# Patient Record
Sex: Female | Born: 1995
Health system: Southern US, Community
[De-identification: ages and names within clinical notes are randomized; demographics above are authoritative.]

## PROBLEM LIST (undated history)

## (undated) ENCOUNTER — Inpatient Hospital Stay (HOSPITAL_COMMUNITY): Payer: Self-pay

## (undated) DIAGNOSIS — Z789 Other specified health status: Secondary | ICD-10-CM

## (undated) HISTORY — PX: FRACTURE SURGERY: SHX138

---

## 2017-07-10 ENCOUNTER — Emergency Department (HOSPITAL_COMMUNITY)
Admission: EM | Admit: 2017-07-10 | Discharge: 2017-07-10 | Disposition: A | Discharge status: Home / Self Care | Payer: Self-pay | Attending: Emergency Medicine | Admitting: Emergency Medicine

## 2017-07-10 ENCOUNTER — Emergency Department (HOSPITAL_COMMUNITY): Payer: Self-pay

## 2017-07-10 ENCOUNTER — Encounter (HOSPITAL_COMMUNITY): Payer: Self-pay

## 2017-07-10 DIAGNOSIS — Y999 Unspecified external cause status: Secondary | ICD-10-CM | POA: Insufficient documentation

## 2017-07-10 DIAGNOSIS — S93491A Sprain of other ligament of right ankle, initial encounter: Secondary | ICD-10-CM

## 2017-07-10 DIAGNOSIS — Y939 Activity, unspecified: Secondary | ICD-10-CM | POA: Insufficient documentation

## 2017-07-10 DIAGNOSIS — X509XXA Other and unspecified overexertion or strenuous movements or postures, initial encounter: Secondary | ICD-10-CM | POA: Insufficient documentation

## 2017-07-10 DIAGNOSIS — Y929 Unspecified place or not applicable: Secondary | ICD-10-CM | POA: Insufficient documentation

## 2017-07-10 DIAGNOSIS — S93431A Sprain of tibiofibular ligament of right ankle, initial encounter: Secondary | ICD-10-CM | POA: Insufficient documentation

## 2017-07-10 MED ORDER — IBUPROFEN 600 MG PO TABS: 600.0000 mg | ORAL_TABLET | Freq: Four times a day (QID) | ORAL | 0 refills | Status: DC | PRN

## 2017-07-10 NOTE — Discharge Instructions (Signed)
Please see the information and instructions below regarding your visit.  Your diagnoses today include:  1. Sprain of anterior talofibular ligament of right ankle, initial encounter     Tests performed today include: See side panel of your discharge paperwork for testing performed today. Vital signs are listed at the bottom of these instructions.    An x-ray of your ankle - does NOT show any broken bones. The x-ray showed he had some swelling in your ankle joint.  Vital signs. See below for your results today.   Medications prescribed:    Take any prescribed medications only as prescribed, and any over the counter medications only as directed on the packaging.  You are prescribed ibuprofen, a non-steroidal anti-inflammatory agent (NSAID) for pain. You may take 600 mg every 6 hours as needed for pain. If still requiring this medication around the clock for acute pain after 10 days, please see your primary healthcare provider.  You may combine this medication with Tylenol, 650 mg every 6 hours, so you are receiving something for pain every 3 hours.  This is not a long-term medication unless under the care and direction of your primary provider. Taking this medication long-term and not under the supervision of a healthcare provider could increase the risk of stomach ulcers, kidney problems, and cardiovascular problems such as high blood pressure.   If you could be pregnant, do not take this medication and speak with your care provider about pain control.  Home care instructions:  Please follow any educational materials contained in this packet.    Follow any educational materials contained in this packet  Follow R.I.C.E. Protocol:  R - rest your injury   I  - use ice on injury without applying directly to skin  C - compress injury with bandage or splint  E - elevate the injury as much as possible   Follow-up instructions: Please follow-up with your primary care provider in for  further evaluation In one week if pain is still occurring.  Return instructions:   Please return if your toes are numb or tingling, appear gray or blue, or you have severe pain (also elevate leg and loosen splint or wrap)  Please return to the Emergency Department if you experience worsening symptoms.   Please return if you have any other emergent concerns.  Additional Information:  Your caregiver has diagnosed you as suffering from an ankle sprain. Ankle sprain occurs when the ligaments that hold the ankle joint together are stretched or torn. It may take 4 to 6 weeks to heal.  For Activity: If prescribed crutches, use crutches with non-weight bearing for the first few days. Then, you may walk on your ankle as the pain allows, or as instructed. Start gradually with weight bearing on the affected ankle. Once you can walk pain free, then try jogging. When you can run forwards, then you can try moving side-to-side. If you cannot walk without crutches in one week, you need a re-check.  Your vital signs today were: BP 112/68 (BP Location: Right Arm)    Pulse 83    Temp 97.9 F (36.6 C) (Oral)    Resp 19    LMP 06/30/2017 (Within Days)    SpO2 100%  If your blood pressure (BP) was elevated on multiple readings during this visit above 130 for the top number or above 80 for the bottom number, please have this repeated by your primary care provider within one month. --------------  Thank you for allowing [place] to  participate in your care today.

## 2017-07-10 NOTE — ED Triage Notes (Signed)
Pt states she was walking and stepped off a curb wrong on Tuesday. She reports pain in the ankle. Pt reports it is difficult to bear weight on the extremity.

## 2017-07-10 NOTE — ED Provider Notes (Signed)
[facility]-EMERGENCY DEPT Provider Note   CSN: [id number] Arrival date & time: 07/10/17  0910     History   Chief Complaint Chief Complaint  Patient presents with  . Foot Injury    HPI Monica Mayer is a 21 y.o. female.  HPI   Patient is a 21 year old female with no significant past medical history presenting for right ankle pain following stepping off a curb 3 days ago and twisting her ankle. Patient reports that this was a mechanical fall and there was no dizziness, lightheadedness, or syncope preceding. Patient reports she was initially weight bearing after the incident but subsequently developed more pain. Patient has been icing the right foot, staying off of the right foot, and taking Advil but had continued pain. Patient denies any loss of sensation to her right foot, or a cold extremity but reports that it felt different.  History reviewed. No pertinent past medical history.  There are no active problems to display for this patient.   History reviewed. No pertinent surgical history.  OB History    No data available       Home Medications    Prior to Admission medications   Medication Sig Start Date End Date Taking? Authorizing Provider  ibuprofen (ADVIL,MOTRIN) 600 MG tablet Take 1 tablet (600 mg total) by mouth every 6 (six) hours as needed. 07/10/17   Elisha PonderMurray, [place] Jost B, PA-C    Family History History reviewed. No pertinent family history.  Social History Social History  Substance Use Topics  . Smoking status: Never Smoker  . Smokeless tobacco: Never Used  . Alcohol use Yes     Comment: occ     Allergies   Patient has no known allergies.   Review of Systems Review of Systems  Musculoskeletal: Positive for joint swelling.  Skin: Negative for color change and pallor.  Neurological: Negative for dizziness, syncope, weakness, light-headedness and numbness.     Physical Exam Updated Vital Signs BP 112/68 (BP Location: Right Arm)   Pulse 83   Temp  97.9 F (36.6 C) (Oral)   Resp 19   LMP 06/30/2017 (Within Days)   SpO2 100%   Physical Exam  Constitutional: She appears well-developed and well-nourished. No distress.  Sitting comfortably in bed.  HENT:  Head: Normocephalic and atraumatic.  Eyes: Conjunctivae are normal. Right eye exhibits no discharge. Left eye exhibits no discharge.  EOMs normal to gross examination.  Neck: Normal range of motion.  Pulmonary/Chest:  Normal respiratory effort. Patient converses comfortably. No audible wheeze or stridor.  Abdominal: She exhibits no distension.  Musculoskeletal:  DP pulses 2+ and equal bilaterally. Sensation intact to light touch in right distal lower extremity. Fibular head tenderness. No tenderness of the medial malleolus. No proximal fibula tenderness. No point tenderness to base of right fifth metatarsal, navicular. Range of motion is poor flexion and extension, eversion of right ankle,  but limited with inversion due to pain.   Neurological: She is alert.  Cranial nerves intact to gross observation. Patient moves extremities without difficulty.  Skin: Skin is warm and dry. She is not diaphoretic.  Psychiatric: She has a normal mood and affect. Her behavior is normal. Judgment and thought content normal.  Nursing note and vitals reviewed.    ED Treatments / Results  Labs (all labs ordered are listed, but only abnormal results are displayed) Labs Reviewed - No data to display  EKG  EKG Interpretation None       Radiology Dg Ankle Complete Right  Result  Date: 07/10/2017 CLINICAL DATA:  Right ankle injury 3 days ago. EXAM: RIGHT ANKLE - COMPLETE 3+ VIEW COMPARISON:  None. FINDINGS: No acute fracture or malalignment. Small tibiotalar joint effusion. The talar dome is intact. The ankle mortise is symmetric. Bone mineralization is normal. Soft tissues are unremarkable. IMPRESSION: Small tibiotalar joint effusion.  No acute osseous abnormality. Electronically Signed   By:  Obie Dredge M.D.   On: 07/10/2017 10:01    Procedures Procedures (including critical care time)  Medications Ordered in ED Medications - No data to display   Initial Impression / Assessment and Plan / ED Course  I have reviewed the triage vital signs and the nursing notes.  Pertinent labs & imaging results that were available during my care of the patient were reviewed by me and considered in my medical decision making (see chart for details).  Clinical Course as of Jul 10 2205  Thu Jul 10, 2017  1134 DG Ankle Complete Right [AM]    Clinical Course User Index [AM] Elisha Ponder, PA-C     Final Clinical Impressions(s) / ED Diagnoses   Final diagnoses:  Sprain of anterior talofibular ligament of right ankle, initial encounter   MDM  Patient is a 21 year old female presenting for right ankle pain following rolling her ankle. Complete x-rays of the right ankle in the ED today demonstrate no evidence of fracture and show mild joint effusion of the tibiotalar joint. Patient was counseled on RICE protocol and told to rest injury, use ice, compress the area, and elevate above the level of their heart as much as possible to reduce swelling. Aircast and crutches dispensed. Questions answered. Patient verbalized understanding.    New Prescriptions Discharge Medication List as of 07/10/2017 12:01 PM    START taking these medications   Details  ibuprofen (ADVIL,MOTRIN) 600 MG tablet Take 1 tablet (600 mg total) by mouth every 6 (six) hours as needed., Starting Thu 07/10/2017, Print         [place], [place] B, PA-C 07/10/17 2210    Tilden Fossa, MD 07/11/17 1212

## 2019-03-09 ENCOUNTER — Encounter (HOSPITAL_BASED_OUTPATIENT_CLINIC_OR_DEPARTMENT_OTHER): Payer: Self-pay | Admitting: Emergency Medicine

## 2019-03-09 ENCOUNTER — Emergency Department (HOSPITAL_BASED_OUTPATIENT_CLINIC_OR_DEPARTMENT_OTHER)
Admission: EM | Admit: 2019-03-09 | Discharge: 2019-03-09 | Disposition: A | Discharge status: Home / Self Care | Payer: No Typology Code available for payment source | Attending: Emergency Medicine | Admitting: Emergency Medicine

## 2019-03-09 ENCOUNTER — Other Ambulatory Visit: Payer: Self-pay

## 2019-03-09 ENCOUNTER — Emergency Department (HOSPITAL_BASED_OUTPATIENT_CLINIC_OR_DEPARTMENT_OTHER): Payer: No Typology Code available for payment source

## 2019-03-09 DIAGNOSIS — M25511 Pain in right shoulder: Secondary | ICD-10-CM | POA: Diagnosis not present

## 2019-03-09 DIAGNOSIS — M25551 Pain in right hip: Secondary | ICD-10-CM | POA: Diagnosis not present

## 2019-03-09 LAB — PREGNANCY, URINE: Preg Test, Ur: NEGATIVE

## 2019-03-09 NOTE — Discharge Instructions (Signed)
Recheck with your doctor. Take Motrin and Tylenol as needed as directed for pain.
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
## 2019-03-12 ENCOUNTER — Emergency Department (HOSPITAL_BASED_OUTPATIENT_CLINIC_OR_DEPARTMENT_OTHER): Payer: 59

## 2019-03-12 ENCOUNTER — Other Ambulatory Visit: Payer: Self-pay

## 2019-03-12 ENCOUNTER — Emergency Department (HOSPITAL_BASED_OUTPATIENT_CLINIC_OR_DEPARTMENT_OTHER)
Admission: EM | Admit: 2019-03-12 | Discharge: 2019-03-12 | Disposition: A | Discharge status: Home / Self Care | Payer: 59 | Attending: Emergency Medicine | Admitting: Emergency Medicine

## 2019-03-12 ENCOUNTER — Encounter (HOSPITAL_BASED_OUTPATIENT_CLINIC_OR_DEPARTMENT_OTHER): Payer: Self-pay | Admitting: Emergency Medicine

## 2019-03-12 DIAGNOSIS — R0789 Other chest pain: Secondary | ICD-10-CM | POA: Diagnosis present

## 2019-03-12 DIAGNOSIS — M791 Myalgia, unspecified site: Secondary | ICD-10-CM | POA: Insufficient documentation

## 2019-03-12 MED ORDER — NAPROXEN 375 MG PO TABS: 375.0000 mg | ORAL_TABLET | Freq: Two times a day (BID) | ORAL | 0 refills | Status: DC

## 2019-03-12 MED ORDER — METHOCARBAMOL 500 MG PO TABS: 500.0000 mg | ORAL_TABLET | Freq: Two times a day (BID) | ORAL | 0 refills | Status: DC

## 2019-03-12 MED FILL — NAPROXEN 375 MG TABLET: 375 | 10 days supply | Qty: 20 | Fill #0

## 2019-03-12 MED FILL — METHOCARBAMOL 500 MG TABLET: 500 | 5 days supply | Qty: 10 | Fill #0
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
## 2019-03-12 NOTE — ED Notes (Signed)
Patient transported to X-ray 
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
## 2019-03-12 NOTE — ED Notes (Signed)
ED Provider at bedside. 
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
## 2020-02-06 ENCOUNTER — Emergency Department (HOSPITAL_BASED_OUTPATIENT_CLINIC_OR_DEPARTMENT_OTHER): Payer: 59

## 2020-02-06 ENCOUNTER — Emergency Department (HOSPITAL_BASED_OUTPATIENT_CLINIC_OR_DEPARTMENT_OTHER)
Admission: EM | Admit: 2020-02-06 | Discharge: 2020-02-06 | Disposition: A | Discharge status: Home / Self Care | Payer: 59 | Attending: Emergency Medicine | Admitting: Emergency Medicine

## 2020-02-06 ENCOUNTER — Encounter (HOSPITAL_BASED_OUTPATIENT_CLINIC_OR_DEPARTMENT_OTHER): Payer: Self-pay | Admitting: Emergency Medicine

## 2020-02-06 ENCOUNTER — Other Ambulatory Visit: Payer: Self-pay

## 2020-02-06 DIAGNOSIS — K208 Other esophagitis without bleeding: Secondary | ICD-10-CM | POA: Diagnosis not present

## 2020-02-06 DIAGNOSIS — J029 Acute pharyngitis, unspecified: Secondary | ICD-10-CM | POA: Diagnosis present

## 2020-02-06 DIAGNOSIS — Z79899 Other long term (current) drug therapy: Secondary | ICD-10-CM | POA: Insufficient documentation

## 2020-02-06 DIAGNOSIS — Z791 Long term (current) use of non-steroidal anti-inflammatories (NSAID): Secondary | ICD-10-CM | POA: Insufficient documentation

## 2020-02-06 DIAGNOSIS — T50905A Adverse effect of unspecified drugs, medicaments and biological substances, initial encounter: Secondary | ICD-10-CM

## 2020-02-06 LAB — PREGNANCY, URINE: Preg Test, Ur: NEGATIVE

## 2020-02-06 LAB — GROUP A STREP BY PCR: Group A Strep by PCR: NOT DETECTED

## 2020-02-06 NOTE — ED Triage Notes (Signed)
Sore throat for 1 week, states she took 2 white pills on Thursday. States they are stuck in her throat. Seen at [facility] last night for same. Airway intact. Speech clear. Able to swallow, ate and drank today without problems.

## 2020-02-06 NOTE — ED Provider Notes (Signed)
[facility] EMERGENCY DEPARTMENT Provider Note   CSN: [id number] Arrival date & time: 02/06/20  0247     History Chief Complaint  Patient presents with  . Sore Throat  . Airway Obstruction    Monica Mayer is a 24 y.o. female.  24 year old female who presents with pills stuck in throat.  3 days ago, the patient took 2 over-the-counter pills for chest congestion and mucus and she feels like the pills got stuck in her throat despite drinking a good amount of water.  Since that time, she has continued to have the sensation that the pills are stuck in her throat.  She is able to swallow liquids and denies any problems with vomiting or shortness of breath.  She was evaluated at an outside hospital ED last night for this problem and eventually discharged home.  She presents here with persistent symptoms.  The history is provided by the patient.  Sore Throat       History reviewed. No pertinent past medical history.  There are no problems to display for this patient.   Past Surgical History:  Procedure Laterality Date  . FRACTURE SURGERY     Right arm fracture repair     OB History   No obstetric history on file.     No family history on file.  Social History   Tobacco Use  . Smoking status: Never Smoker  . Smokeless tobacco: Never Used  Substance Use Topics  . Alcohol use: Yes    Comment: occ  . Drug use: Yes    Types: Marijuana    Home Medications Prior to Admission medications   Medication Sig Start Date End Date Taking? Authorizing Provider  methocarbamol (ROBAXIN) 500 MG tablet Take 1 tablet (500 mg total) by mouth 2 (two) times daily. 03/12/19   Langston Masker B, PA-C  naproxen (NAPROSYN) 375 MG tablet Take 1 tablet (375 mg total) by mouth 2 (two) times daily. 03/12/19   Langston Masker B, PA-C    Allergies    Patient has no known allergies.  Review of Systems   Review of Systems All other systems reviewed and are negative except that which  was mentioned in HPI  Physical Exam Updated Vital Signs BP 110/64   Pulse 95   Temp 98.5 F (36.9 C) (Oral)   Resp 16   Ht 5\' 2"  (1.575 m)   Wt 68 kg   LMP 12/28/2019 (Approximate)   SpO2 98%   BMI 27.44 kg/m   Physical Exam Vitals and nursing note reviewed.  Constitutional:      General: She is not in acute distress.    Appearance: She is well-developed.  HENT:     Head: Normocephalic and atraumatic.  Eyes:     Conjunctiva/sclera: Conjunctivae normal.  Cardiovascular:     Rate and Rhythm: Normal rate and regular rhythm.     Heart sounds: Normal heart sounds. No murmur.  Pulmonary:     Effort: Pulmonary effort is normal.     Breath sounds: No stridor.     Comments: Frequent cough, rhonchi b/l but no wheezing or crackles Musculoskeletal:     Cervical back: Neck supple.  Skin:    General: Skin is warm and dry.  Neurological:     Mental Status: She is alert and oriented to person, place, and time.     Comments: Fluent speech  Psychiatric:        Judgment: Judgment normal.     ED Results / Procedures /  Treatments   Labs (all labs ordered are listed, but only abnormal results are displayed) Labs Reviewed  GROUP A STREP BY PCR  PREGNANCY, URINE    EKG None  Radiology DG Neck Soft Tissue  Result Date: 02/06/2020 CLINICAL DATA:  Foreign body sensation in the throat. EXAM: NECK SOFT TISSUES - 1+ VIEW COMPARISON:  None. FINDINGS: There is no evidence of retropharyngeal soft tissue swelling or epiglottic enlargement. The cervical airway is unremarkable and no radio-opaque foreign body identified. IMPRESSION: Negative. Electronically Signed   By: Elige Ko   On: 02/06/2020 06:36    Procedures Procedures (including critical care time)  Medications Ordered in ED Medications - No data to display  ED Course  I have reviewed the triage vital signs and the nursing notes.  Pertinent labs & imaging results that were available during my care of the patient were  reviewed by me and considered in my medical decision making (see chart for details).    MDM Rules/Calculators/A&P                      Pt well appearing, tolerating liquids here. Hx and timeline suggest pill esophagitis. Counseled on supportive measures and expectant management. Final Clinical Impression(s) / ED Diagnoses Final diagnoses:  Pill esophagitis    Rx / DC Orders ED Discharge Orders    None       Goebel Hellums, Ambrose [place], MD 02/06/20 [phone]

## 2020-11-13 ENCOUNTER — Encounter (HOSPITAL_BASED_OUTPATIENT_CLINIC_OR_DEPARTMENT_OTHER): Payer: Self-pay

## 2020-11-13 ENCOUNTER — Emergency Department (HOSPITAL_BASED_OUTPATIENT_CLINIC_OR_DEPARTMENT_OTHER)
Admission: EM | Admit: 2020-11-13 | Discharge: 2020-11-14 | Disposition: A | Discharge status: Home / Self Care | Payer: 59 | Attending: Emergency Medicine | Admitting: Emergency Medicine

## 2020-11-13 ENCOUNTER — Other Ambulatory Visit: Payer: Self-pay

## 2020-11-13 DIAGNOSIS — R112 Nausea with vomiting, unspecified: Secondary | ICD-10-CM | POA: Diagnosis not present

## 2020-11-13 DIAGNOSIS — R102 Pelvic and perineal pain: Secondary | ICD-10-CM | POA: Insufficient documentation

## 2020-11-13 DIAGNOSIS — R197 Diarrhea, unspecified: Secondary | ICD-10-CM | POA: Insufficient documentation

## 2020-11-13 DIAGNOSIS — R109 Unspecified abdominal pain: Secondary | ICD-10-CM

## 2020-11-13 DIAGNOSIS — K529 Noninfective gastroenteritis and colitis, unspecified: Secondary | ICD-10-CM

## 2020-11-13 LAB — URINALYSIS, MICROSCOPIC (REFLEX): RBC / HPF: NONE SEEN RBC/hpf (ref 0–5)

## 2020-11-13 LAB — URINALYSIS, ROUTINE W REFLEX MICROSCOPIC
Bilirubin Urine: NEGATIVE
Glucose, UA: NEGATIVE mg/dL
Hgb urine dipstick: NEGATIVE
Ketones, ur: 40 mg/dL — AB
Nitrite: NEGATIVE
Protein, ur: NEGATIVE mg/dL
Specific Gravity, Urine: 1.005 (ref 1.005–1.030)
pH: 8.5 — ABNORMAL HIGH (ref 5.0–8.0)

## 2020-11-13 LAB — PREGNANCY, URINE: Preg Test, Ur: NEGATIVE

## 2020-11-13 MED ORDER — SODIUM CHLORIDE 0.9 % IV BOLUS
1000.0000 mL | Freq: Once | INTRAVENOUS | Status: AC
  Administered 2020-11-14: 1000 mL via INTRAVENOUS

## 2020-11-13 NOTE — ED Triage Notes (Addendum)
Pt presents from home with pelvic pain since Saturday. Also endorses nausea and vomiting. States she has had some blood in her urine or vaginal bleeding as well.
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
## 2020-11-13 NOTE — ED Notes (Signed)
Pt states abdomial/pelvic pain with nausea and 5 ep of vomiting, states soft to liquid stool with some blood

## 2020-11-14 ENCOUNTER — Emergency Department (HOSPITAL_BASED_OUTPATIENT_CLINIC_OR_DEPARTMENT_OTHER): Payer: 59

## 2020-11-14 MED ORDER — IOHEXOL 300 MG/ML  SOLN
100.0000 mL | Freq: Once | INTRAMUSCULAR | Status: AC | PRN
  Administered 2020-11-14: 80 mL via INTRAVENOUS

## 2020-11-14 NOTE — Discharge Instructions (Addendum)
Drink plenty of fluids and get plenty of rest.  Follow-up with primary doctor if symptoms or not improving in the next 2 to 3 days, and return to the ER for any new and/or concerning symptoms.

## 2020-11-15 LAB — GC/CHLAMYDIA PROBE AMP (~~LOC~~) NOT AT ARMC
Chlamydia: POSITIVE — AB
Comment: NEGATIVE
Comment: NORMAL
Neisseria Gonorrhea: NEGATIVE

## 2020-11-24 ENCOUNTER — Telehealth: Payer: Self-pay | Admitting: Medical

## 2020-11-24 DIAGNOSIS — A749 Chlamydial infection, unspecified: Secondary | ICD-10-CM

## 2020-11-24 MED ORDER — AZITHROMYCIN 250 MG PO TABS: 1000.0000 mg | ORAL_TABLET | Freq: Once | ORAL | 0 refills | Status: AC

## 2020-11-24 NOTE — Telephone Encounter (Signed)
Monica Mayer tested positive for  Chlamydia. Patient was called by RN and allergies and pharmacy confirmed. Rx sent to pharmacy of choice.   Marny Lowenstein, PA-C 11/24/2020 4:22 PM

## 2021-01-05 ENCOUNTER — Encounter (HOSPITAL_COMMUNITY): Payer: Self-pay | Admitting: *Deleted

## 2021-01-05 ENCOUNTER — Other Ambulatory Visit: Payer: Self-pay

## 2021-01-05 ENCOUNTER — Emergency Department (HOSPITAL_COMMUNITY)
Admission: EM | Admit: 2021-01-05 | Discharge: 2021-01-05 | Disposition: A | Discharge status: Home / Self Care | Payer: 59 | Attending: Emergency Medicine | Admitting: Emergency Medicine

## 2021-01-05 DIAGNOSIS — N750 Cyst of Bartholin's gland: Secondary | ICD-10-CM | POA: Diagnosis not present

## 2021-01-05 MED ORDER — LIDOCAINE HCL (PF) 2 % IJ SOLN: 10.0000 mL | Freq: Once | INTRAMUSCULAR | Status: DC

## 2021-01-05 MED ORDER — LIDOCAINE HCL (PF) 1 % IJ SOLN
INTRAMUSCULAR | Status: AC
  Filled 2021-01-05: qty 30

## 2021-01-05 MED ORDER — DOXYCYCLINE HYCLATE 100 MG PO TABS: 100.0000 mg | ORAL_TABLET | Freq: Two times a day (BID) | ORAL | 0 refills | Status: DC

## 2021-01-05 NOTE — ED Triage Notes (Signed)
States she has a cyst on her right labia, history of same

## 2021-01-05 NOTE — Discharge Instructions (Addendum)
Return if any problems.  The catheter will need to come out in 4 days  Schedule to see obgyn for evaltuation.

## 2021-01-05 NOTE — ED Notes (Signed)
Entered room and introduced self to patient. Pt appears to be resting in bed, respirations are even and unlabored with equal chest rise and fall. Bed is locked in the lowest position, side rails x2, call bell within reach. Pt educated on call light use and hourly rounding, verbalized understanding and in agreement at this time. All questions and concerns voiced addressed. Refreshments offered and provided per patient request.  

## 2021-01-05 NOTE — ED Notes (Signed)
ED Provider at bedside for cyst I&D.

## 2021-01-06 NOTE — ED Provider Notes (Signed)
[facility] EMERGENCY DEPARTMENT Provider Note   CSN: [id number] Arrival date & time: 01/05/21  1619     History Chief Complaint  Patient presents with  . Bartholin's Cyst    Eleyna Brugh is a 25 y.o. female.  Pt complains of a bartolin cyst.  Pt has had 2 previous cyst  The history is provided by the patient. No language interpreter was used.  Abscess Location:  Torso Size:  2 Abscess quality: redness   Progression:  Worsening Chronicity:  New Relieved by:  Nothing Worsened by:  Nothing Ineffective treatments:  None tried Associated symptoms: no nausea        History reviewed. No pertinent past medical history.  There are no problems to display for this patient.   Past Surgical History:  Procedure Laterality Date  . FRACTURE SURGERY     Right arm fracture repair     OB History   No obstetric history on file.     No family history on file.  Social History   Tobacco Use  . Smoking status: Never Smoker  . Smokeless tobacco: Never Used  Vaping Use  . Vaping Use: Every day  Substance Use Topics  . Alcohol use: Yes    Comment: occ  . Drug use: Yes    Types: Marijuana    Home Medications Prior to Admission medications   Medication Sig Start Date End Date Taking? Authorizing Provider  doxycycline (VIBRA-TABS) 100 MG tablet Take 1 tablet (100 mg total) by mouth 2 (two) times daily. 01/05/21  Yes Cheron Schaumann K, PA-C  methocarbamol (ROBAXIN) 500 MG tablet Take 1 tablet (500 mg total) by mouth 2 (two) times daily. 03/12/19   Aviva Kluver B, PA-C  naproxen (NAPROSYN) 375 MG tablet Take 1 tablet (375 mg total) by mouth 2 (two) times daily. 03/12/19   Aviva Kluver B, PA-C    Allergies    Patient has no known allergies.  Review of Systems   Review of Systems  Gastrointestinal: Negative for nausea.  All other systems reviewed and are negative.   Physical Exam Updated Vital Signs BP 118/71 (BP Location: Right Arm)   Pulse 67   Temp 98.2 F  (36.8 C) (Oral)   Resp 16   SpO2 100%   Physical Exam Vitals and nursing note reviewed.  Constitutional:      Appearance: She is well-developed and well-nourished.  HENT:     Head: Normocephalic.  Eyes:     Extraocular Movements: EOM normal.  Cardiovascular:     Rate and Rhythm: Normal rate.  Pulmonary:     Effort: Pulmonary effort is normal.  Abdominal:     General: There is no distension.  Genitourinary:    Comments: Swollen right labia Musculoskeletal:        General: Normal range of motion.     Cervical back: Normal range of motion.  Neurological:     General: No focal deficit present.     Mental Status: She is alert and oriented to person, place, and time.  Psychiatric:        Mood and Affect: Mood and affect and mood normal.     ED Results / Procedures / Treatments   Labs (all labs ordered are listed, but only abnormal results are displayed) Labs Reviewed - No data to display  EKG None  Radiology No results found.  Procedures .Marland KitchenIncision and Drainage  Date/Time: 01/06/2021 3:57 PM Performed by: Elson Areas, PA-C Authorized by: Elson Areas, PA-C  Consent:    Consent obtained:  Verbal   Consent given by:  Patient   Risks discussed:  Bleeding, incomplete drainage, pain and damage to other organs   Alternatives discussed:  No treatment Universal protocol:    Procedure explained and questions answered to patient or proxy's satisfaction: yes     Relevant documents present and verified: yes     Test results available : yes     Imaging studies available: yes     Required blood products, implants, devices, and special equipment available: yes     Site/side marked: yes     Immediately prior to procedure, a time out was called: yes     Patient identity confirmed:  Verbally with patient Location:    Type:  Bartholin cyst   Size:  2   Location:  Anogenital   Anogenital location:  Bartholin's gland Pre-procedure details:    Skin preparation:   Betadine Anesthesia:    Anesthesia method:  Local infiltration   Local anesthetic:  Lidocaine 1% w/o epi Procedure type:    Complexity:  Complex Procedure details:    Ultrasound guidance: no     Incision types:  Single straight   Incision depth:  Subcutaneous   Scalpel blade:  11   Wound management:  Probed and deloculated, irrigated with saline and extensive cleaning   Drainage:  Purulent   Drainage amount:  Moderate   Packing materials:  Word catheter (Word catheter) Post-procedure details:    Procedure completion:  Tolerated well, no immediate complications     Medications Ordered in ED Medications - No data to display  ED Course  I have reviewed the triage vital signs and the nursing notes.  Pertinent labs & imaging results that were available during my care of the patient were reviewed by me and considered in my medical decision making (see chart for details).    MDM Rules/Calculators/A&P                          MDM:  Pt advised to follow up with gyn in 4 weeks.  Final Clinical Impression(s) / ED Diagnoses Final diagnoses:  Bartholin cyst    Rx / DC Orders ED Discharge Orders         Ordered    doxycycline (VIBRA-TABS) 100 MG tablet  2 times daily        01/05/21 1905        An After Visit Summary was printed and given to the patient.    Elson Areas, [place] 01/06/21 1558    Maia Plan, MD 01/08/21 1023

## 2021-04-16 ENCOUNTER — Other Ambulatory Visit: Payer: Self-pay

## 2021-04-16 ENCOUNTER — Emergency Department (HOSPITAL_COMMUNITY)
Admission: EM | Admit: 2021-04-16 | Discharge: 2021-04-16 | Disposition: A | Discharge status: Home / Self Care | Payer: 59 | Attending: Emergency Medicine | Admitting: Emergency Medicine

## 2021-04-16 ENCOUNTER — Encounter (HOSPITAL_COMMUNITY): Payer: Self-pay | Admitting: Emergency Medicine

## 2021-04-16 DIAGNOSIS — W57XXXA Bitten or stung by nonvenomous insect and other nonvenomous arthropods, initial encounter: Secondary | ICD-10-CM | POA: Diagnosis not present

## 2021-04-16 DIAGNOSIS — S30860A Insect bite (nonvenomous) of lower back and pelvis, initial encounter: Secondary | ICD-10-CM | POA: Insufficient documentation

## 2021-04-16 MED ORDER — DOXYCYCLINE HYCLATE 100 MG PO TABS
200.0000 mg | ORAL_TABLET | Freq: Once | ORAL | Status: AC
  Administered 2021-04-16: 200 mg via ORAL
  Filled 2021-04-16: qty 2

## 2021-04-16 NOTE — ED Triage Notes (Signed)
Pt c/o of tick bite to left lower back. Tick still attached. Needs assistance with removal.

## 2021-04-16 NOTE — Discharge Instructions (Signed)
The tick was removed successfully. Keep the wound site clean.  If you start noticing any rash develop, have your primary care doctor assess you immediately.

## 2021-04-16 NOTE — ED Provider Notes (Signed)
[facility] EMERGENCY DEPARTMENT Provider Note   CSN: [id number] Arrival date & time: 04/16/21  8469     History Chief Complaint  Patient presents with  . Insect Bite    Monica Mayer is a 25 y.o. female.  HPI    25 year old female comes in with chief complaint of insect bite.  Patient reports that she woke up this morning, noted a tick on her back.  She is quite certain that she did not have a tick on her yesterday.  She did do some yard work yesterday that might have led to the tick exposure.  History reviewed. No pertinent past medical history.  There are no problems to display for this patient.   Past Surgical History:  Procedure Laterality Date  . FRACTURE SURGERY     Right arm fracture repair     OB History   No obstetric history on file.     History reviewed. No pertinent family history.  Social History   Tobacco Use  . Smoking status: Never Smoker  . Smokeless tobacco: Never Used  Vaping Use  . Vaping Use: Every day  Substance Use Topics  . Alcohol use: Yes    Comment: occ  . Drug use: Yes    Types: Marijuana    Home Medications Prior to Admission medications   Medication Sig Start Date End Date Taking? Authorizing Provider  doxycycline (VIBRA-TABS) 100 MG tablet Take 1 tablet (100 mg total) by mouth 2 (two) times daily. Patient not taking: Reported on 04/16/2021 01/05/21   Elson Areas, PA-C  methocarbamol (ROBAXIN) 500 MG tablet Take 1 tablet (500 mg total) by mouth 2 (two) times daily. Patient not taking: Reported on 04/16/2021 03/12/19   Aviva Kluver B, PA-C  naproxen (NAPROSYN) 375 MG tablet Take 1 tablet (375 mg total) by mouth 2 (two) times daily. Patient not taking: Reported on 04/16/2021 03/12/19   Aviva Kluver B, PA-C    Allergies    No known allergies  Review of Systems   Review of Systems  Constitutional: Negative for activity change.  Skin: Negative for rash.    Physical Exam Updated Vital Signs BP 107/71 (BP Location: Left  Arm)   Pulse 63   Temp 98.1 F (36.7 C) (Oral)   Resp 14   Ht 5\' 2"  (1.575 m)   Wt 62.6 kg   SpO2 100%   BMI 25.24 kg/m   Physical Exam Vitals and nursing note reviewed.  Pulmonary:     Effort: Pulmonary effort is normal.  Skin:    Findings: No rash.     Comments: Engorged tick attached to the lower back, left side.  + Small area of surrounding erythema  Neurological:     Mental Status: She is alert.     ED Results / Procedures / Treatments   Labs (all labs ordered are listed, but only abnormal results are displayed) Labs Reviewed - No data to display  EKG None  Radiology No results found.  Procedures .Foreign Body Removal  Date/Time: 04/16/2021 9:06 AM Performed by: 06/16/2021, MD Authorized by: Derwood Kaplan, MD  Consent: Verbal consent obtained. Risks and benefits: risks, benefits and alternatives were discussed Consent given by: patient Patient understanding: patient states understanding of the procedure being performed Patient identity confirmed: arm band Time out: Immediately prior to procedure a "time out" was called to verify the correct patient, procedure, equipment, support staff and site/side marked as required. Body area: skin General location: trunk Location details: back  Sedation:  Patient sedated: no  Patient restrained: no Patient cooperative: yes 1 objects recovered. Objects recovered: Engorged tick Post-procedure assessment: foreign body removed     Medications Ordered in ED Medications  doxycycline (VIBRA-TABS) tablet 200 mg (has no administration in time range)    ED Course  I have reviewed the triage vital signs and the nursing notes.  Pertinent labs & imaging results that were available during my care of the patient were reviewed by me and considered in my medical decision making (see chart for details).    MDM Rules/Calculators/A&P                          25 year old comes in a chief complaint of tick  bite. Patient has an engorged tick attached to the back, it was removed successfully without any problems.  The wound site was cleaned with antiseptic. 200 mg oral doxycycline to be given.  Tick bite information provided  Final Clinical Impression(s) / ED Diagnoses Final diagnoses:  Tick bite of lower back, initial encounter    Rx / DC Orders ED Discharge Orders    None       Derwood Kaplan, MD 04/16/21 [phone]

## 2021-05-31 ENCOUNTER — Emergency Department (HOSPITAL_COMMUNITY)
Admission: EM | Admit: 2021-05-31 | Discharge: 2021-05-31 | Disposition: A | Discharge status: Home / Self Care | Payer: 59 | Attending: Emergency Medicine | Admitting: Emergency Medicine

## 2021-05-31 ENCOUNTER — Other Ambulatory Visit: Payer: Self-pay

## 2021-05-31 ENCOUNTER — Encounter (HOSPITAL_COMMUNITY): Payer: Self-pay | Admitting: *Deleted

## 2021-05-31 DIAGNOSIS — R195 Other fecal abnormalities: Secondary | ICD-10-CM

## 2021-05-31 DIAGNOSIS — R197 Diarrhea, unspecified: Secondary | ICD-10-CM | POA: Insufficient documentation

## 2021-05-31 LAB — CBC WITH DIFFERENTIAL/PLATELET
Abs Immature Granulocytes: 0.01 10*3/uL (ref 0.00–0.07)
Basophils Absolute: 0 10*3/uL (ref 0.0–0.1)
Basophils Relative: 0 %
Eosinophils Absolute: 0.1 10*3/uL (ref 0.0–0.5)
Eosinophils Relative: 2 %
HCT: 42.3 % (ref 36.0–46.0)
Hemoglobin: 13.6 g/dL (ref 12.0–15.0)
Immature Granulocytes: 0 %
Lymphocytes Relative: 43 %
Lymphs Abs: 2.8 10*3/uL (ref 0.7–4.0)
MCH: 29.3 pg (ref 26.0–34.0)
MCHC: 32.2 g/dL (ref 30.0–36.0)
MCV: 91.2 fL (ref 80.0–100.0)
Monocytes Absolute: 0.5 10*3/uL (ref 0.1–1.0)
Monocytes Relative: 8 %
Neutro Abs: 3 10*3/uL (ref 1.7–7.7)
Neutrophils Relative %: 47 %
Platelets: 159 10*3/uL (ref 150–400)
RBC: 4.64 MIL/uL (ref 3.87–5.11)
RDW: 14.1 % (ref 11.5–15.5)
WBC: 6.4 10*3/uL (ref 4.0–10.5)
nRBC: 0 % (ref 0.0–0.2)

## 2021-05-31 LAB — BASIC METABOLIC PANEL
Anion gap: 8 (ref 5–15)
BUN: 9 mg/dL (ref 6–20)
CO2: 25 mmol/L (ref 22–32)
Calcium: 9.6 mg/dL (ref 8.9–10.3)
Chloride: 100 mmol/L (ref 98–111)
Creatinine, Ser: 0.66 mg/dL (ref 0.44–1.00)
GFR, Estimated: >60 mL/min (ref >60)
Glucose, Bld: 92 mg/dL (ref 70–99)
Potassium: 4 mmol/L (ref 3.5–5.1)
Sodium: 133 mmol/L — ABNORMAL LOW (ref 135–145)

## 2021-05-31 LAB — PREGNANCY, URINE: Preg Test, Ur: NEGATIVE

## 2021-05-31 NOTE — ED Provider Notes (Signed)
[facility] EMERGENCY DEPARTMENT Provider Note   CSN: [id number] Arrival date & time: 05/31/21  1019     History Chief Complaint  Patient presents with   Blood In Stools    Monica Mayer is a 25 y.o. female.  Patient presents with intermittent episodes of blood in the stool for the past few months.  Initially thought it was a fissure however it persists.  No pain.  No abdominal tenderness or vomiting.  No blood thinner use.  No history of gastroenterologist or scope.  No syncope or persistent lightheadedness.  No fevers or chills.  No urinary symptoms.      History reviewed. No pertinent past medical history.  There are no problems to display for this patient.   Past Surgical History:  Procedure Laterality Date   FRACTURE SURGERY     Right arm fracture repair     OB History   No obstetric history on file.     No family history on file.  Social History   Tobacco Use   Smoking status: Never   Smokeless tobacco: Never  Vaping Use   Vaping Use: Every day  Substance Use Topics   Alcohol use: Yes    Comment: occ   Drug use: Yes    Types: Marijuana    Home Medications Prior to Admission medications   Medication Sig Start Date End Date Taking? Authorizing Provider  doxycycline (VIBRA-TABS) 100 MG tablet Take 1 tablet (100 mg total) by mouth 2 (two) times daily. Patient not taking: Reported on 04/16/2021 01/05/21   Elson Areas, PA-C  methocarbamol (ROBAXIN) 500 MG tablet Take 1 tablet (500 mg total) by mouth 2 (two) times daily. Patient not taking: Reported on 04/16/2021 03/12/19   Aviva Kluver B, PA-C  naproxen (NAPROSYN) 375 MG tablet Take 1 tablet (375 mg total) by mouth 2 (two) times daily. Patient not taking: Reported on 04/16/2021 03/12/19   Aviva Kluver B, PA-C    Allergies    No known allergies  Review of Systems   Review of Systems  Constitutional:  Negative for chills and fever.  HENT:  Negative for congestion.   Eyes:  Negative for visual  disturbance.  Respiratory:  Negative for shortness of breath.   Cardiovascular:  Negative for chest pain.  Gastrointestinal:  Positive for blood in stool. Negative for abdominal pain and vomiting.  Genitourinary:  Negative for dysuria and flank pain.  Musculoskeletal:  Negative for back pain, neck pain and neck stiffness.  Skin:  Negative for rash.  Neurological:  Negative for light-headedness and headaches.   Physical Exam Updated Vital Signs BP 114/73 (BP Location: Right Arm)   Pulse 69   Temp 99.1 F (37.3 C) (Oral)   Resp 18   Ht 5\' 2"  (1.575 m)   LMP 04/27/2021 (Exact Date)   SpO2 98%   BMI 25.24 kg/m   Physical Exam Vitals and nursing note reviewed.  Constitutional:      General: She is not in acute distress.    Appearance: She is well-developed.  HENT:     Head: Normocephalic and atraumatic.     Mouth/Throat:     Mouth: Mucous membranes are moist.  Eyes:     General:        Right eye: No discharge.        Left eye: No discharge.     Conjunctiva/sclera: Conjunctivae normal.  Neck:     Trachea: No tracheal deviation.  Cardiovascular:     Rate and Rhythm:  Normal rate.  Pulmonary:     Effort: Pulmonary effort is normal.  Abdominal:     General: There is no distension.     Palpations: Abdomen is soft.     Tenderness: There is no abdominal tenderness. There is no guarding.  Genitourinary:    Comments: Nurse chaperone present.  No external hemorrhoids.  No gross blood, brown stool.  No fissure appreciated. Musculoskeletal:        General: Normal range of motion.     Cervical back: Normal range of motion and neck supple. No rigidity.  Skin:    General: Skin is warm.     Capillary Refill: Capillary refill takes less than 2 seconds.     Findings: No rash.  Neurological:     General: No focal deficit present.     Mental Status: She is alert.     Cranial Nerves: No cranial nerve deficit.  Psychiatric:        Mood and Affect: Mood normal.    ED Results /  Procedures / Treatments   Labs (all labs ordered are listed, but only abnormal results are displayed) Labs Reviewed  BASIC METABOLIC PANEL - Abnormal; Notable for the following components:      Result Value   Sodium 133 (*)    All other components within normal limits  CBC WITH DIFFERENTIAL/PLATELET  PREGNANCY, URINE    EKG None  Radiology No results found.  Procedures Procedures   Medications Ordered in ED Medications - No data to display  ED Course  I have reviewed the triage vital signs and the nursing notes.  Pertinent labs & imaging results that were available during my care of the patient were reviewed by me and considered in my medical decision making (see chart for details).    MDM Rules/Calculators/A&P                           Patient presents with intermittent blood in the stool.  Patient's young and healthy otherwise, no emergent cause for admission or imaging at this time.  Discussed possible internal hemorrhoid, diverticular bleed or other.  Patient stable for outpatient follow-up, vital signs unremarkable normal reviewed, hemoglobin within normal range.  Follow-up with gastroenterology discussed.  Remaining blood work unremarkable mild sodium level 133.  Pregnancy test negative.  Hemoccult done at bedside faintly positive.  Final Clinical Impression(s) / ED Diagnoses Final diagnoses:  Occult blood in stools    Rx / DC Orders ED Discharge Orders     None        Blane Ohara, MD 05/31/21 1334

## 2021-05-31 NOTE — Discharge Instructions (Addendum)
Follow-up closely with gastroenterology for further recommendations. Avoid NSAIDs such as ibuprofen/Motrin. Return for new concerns which may include passing out, extreme fatigue, persistent lightheadedness or worsening bleeding.

## 2021-05-31 NOTE — ED Triage Notes (Signed)
States she is here to be checked. States she has had episodes of rectal bleeding for quite a while and has been treated here for same. States she sometimes is nauseated and has multiple other complaints. Denies pain at present

## 2021-10-31 ENCOUNTER — Emergency Department (HOSPITAL_BASED_OUTPATIENT_CLINIC_OR_DEPARTMENT_OTHER): Payer: No Typology Code available for payment source

## 2021-10-31 ENCOUNTER — Other Ambulatory Visit: Payer: Self-pay

## 2021-10-31 ENCOUNTER — Encounter (HOSPITAL_BASED_OUTPATIENT_CLINIC_OR_DEPARTMENT_OTHER): Payer: Self-pay

## 2021-10-31 ENCOUNTER — Emergency Department (HOSPITAL_BASED_OUTPATIENT_CLINIC_OR_DEPARTMENT_OTHER)
Admission: EM | Admit: 2021-10-31 | Discharge: 2021-11-01 | Disposition: A | Discharge status: Home / Self Care | Payer: No Typology Code available for payment source | Attending: Emergency Medicine | Admitting: Emergency Medicine

## 2021-10-31 DIAGNOSIS — S62521A Displaced fracture of distal phalanx of right thumb, initial encounter for closed fracture: Secondary | ICD-10-CM | POA: Insufficient documentation

## 2021-10-31 DIAGNOSIS — M79644 Pain in right finger(s): Secondary | ICD-10-CM | POA: Diagnosis not present

## 2021-10-31 DIAGNOSIS — W231XXA Caught, crushed, jammed, or pinched between stationary objects, initial encounter: Secondary | ICD-10-CM | POA: Diagnosis not present

## 2021-10-31 DIAGNOSIS — Y990 Civilian activity done for income or pay: Secondary | ICD-10-CM | POA: Diagnosis not present

## 2021-10-31 DIAGNOSIS — S60011A Contusion of right thumb without damage to nail, initial encounter: Secondary | ICD-10-CM

## 2021-10-31 DIAGNOSIS — S6991XA Unspecified injury of right wrist, hand and finger(s), initial encounter: Secondary | ICD-10-CM | POA: Diagnosis present

## 2021-10-31 NOTE — ED Provider Notes (Signed)
[facility] EMERGENCY DEPARTMENT Provider Note   CSN: [id number] Arrival date & time: 10/31/21  2001     History Chief Complaint  Patient presents with   Finger Injury    Monica Mayer is a 25 y.o. female.  Patient is a 26 year old female with no significant past medical history.  She presents today for evaluation of a right thumb injury.  She was putting trash in the receptacle when the lid slammed and landed on her thumb.  She describes pain under the nail and to the IP joint.  Pain relieved with rest and worsened with exertion.  The history is provided by the patient.      History reviewed. No pertinent past medical history.  There are no problems to display for this patient.   Past Surgical History:  Procedure Laterality Date   FRACTURE SURGERY     Right arm fracture repair     OB History   No obstetric history on file.     No family history on file.  Social History   Tobacco Use   Smoking status: Never   Smokeless tobacco: Never  Vaping Use   Vaping Use: Every day  Substance Use Topics   Alcohol use: Yes    Comment: occ   Drug use: Yes    Types: Marijuana    Home Medications Prior to Admission medications   Medication Sig Start Date End Date Taking? Authorizing Provider  doxycycline (VIBRA-TABS) 100 MG tablet Take 1 tablet (100 mg total) by mouth 2 (two) times daily. Patient not taking: Reported on 04/16/2021 01/05/21   Elson Areas, PA-C  methocarbamol (ROBAXIN) 500 MG tablet Take 1 tablet (500 mg total) by mouth 2 (two) times daily. Patient not taking: Reported on 04/16/2021 03/12/19   Aviva Kluver B, PA-C  naproxen (NAPROSYN) 375 MG tablet Take 1 tablet (375 mg total) by mouth 2 (two) times daily. Patient not taking: Reported on 04/16/2021 03/12/19   Aviva Kluver B, PA-C    Allergies    No known allergies  Review of Systems   Review of Systems  All other systems reviewed and are negative.  Physical Exam Updated Vital Signs BP  113/64 (BP Location: Left Arm)    Pulse 80    Temp 98.6 F (37 C) (Oral)    Resp 18    Ht 5\' 2"  (1.575 m)    Wt 61.7 kg    LMP 10/11/2021    SpO2 100%    BMI 24.87 kg/m   Physical Exam Vitals and nursing note reviewed.  Constitutional:      General: She is not in acute distress.    Appearance: Normal appearance. She is not ill-appearing.  HENT:     Head: Normocephalic and atraumatic.  Pulmonary:     Effort: Pulmonary effort is normal.  Musculoskeletal:     Comments: The right thumb has a small subungual hematoma noted.  There is no significant swelling and tissues are soft.  Capillary refill is brisk and sensation is intact throughout the entire thumb.  She is able to flex and extend the thumb with no difficulty.  Skin:    General: Skin is warm and dry.  Neurological:     Mental Status: She is alert.    ED Results / Procedures / Treatments   Labs (all labs ordered are listed, but only abnormal results are displayed) Labs Reviewed - No data to display  EKG None  Radiology DG Finger Thumb Right  Result Date: 10/31/2021  CLINICAL DATA:  Injury. EXAM: RIGHT THUMB 2+V COMPARISON:  None. FINDINGS: There is a tiny density along the ventral aspect of the first interphalangeal joint. This is favored as chronic, although a small acute avulsion fracture can not be entirely excluded. There are no other areas suspicious for acute fracture. Joint spaces are well maintained soft tissues are within normal limits. IMPRESSION: 1. Can not exclude tiny avulsion fracture along the first ventral aspect of the interphalangeal joint. Please correlate for point tenderness. Electronically Signed   By: Darliss Cheney M.D.   On: 10/31/2021 20:42    Procedures Procedures   Medications Ordered in ED Medications - No data to display  ED Course  I have reviewed the triage vital signs and the nursing notes.  Pertinent labs & imaging results that were available during my care of the patient were reviewed by  me and considered in my medical decision making (see chart for details).    MDM Rules/Calculators/A&P  X-rays show perhaps a small avulsion fracture at the base of the distal phalanx of the thumb, but tendons are intact.  There is also a small subungual hematoma which I do not feel as though drainage is warranted.  Patient will be placed in a splint and advised to ice, rest, and follow-up with primary as needed if not improving.  Final Clinical Impression(s) / ED Diagnoses Final diagnoses:  None    Rx / DC Orders ED Discharge Orders     None        Geoffery Lyons, MD 10/31/21 2350

## 2021-10-31 NOTE — Discharge Instructions (Signed)
Wear splint for the next several days for comfort and protection.  Ice for 20 minutes every 2 hours while awake for the next 2 days.  Follow-up with primary doctor if not improving in the next week.  Take ibuprofen 600 mg every 6 hours as needed for pain.

## 2021-10-31 NOTE — ED Triage Notes (Signed)
Pt states she got right thumb caught between a garbage can and metal frame at work ~10pm yesterday-no break in skin-NAD-steady gait

## 2022-04-10 IMAGING — DX DG FINGER THUMB 2+V*R*
3 series · 3 of 3 positions shown · non-contrast
Comparison: None.

CLINICAL DATA: Injury.

EXAM:
RIGHT THUMB 2+V

[finger ap]
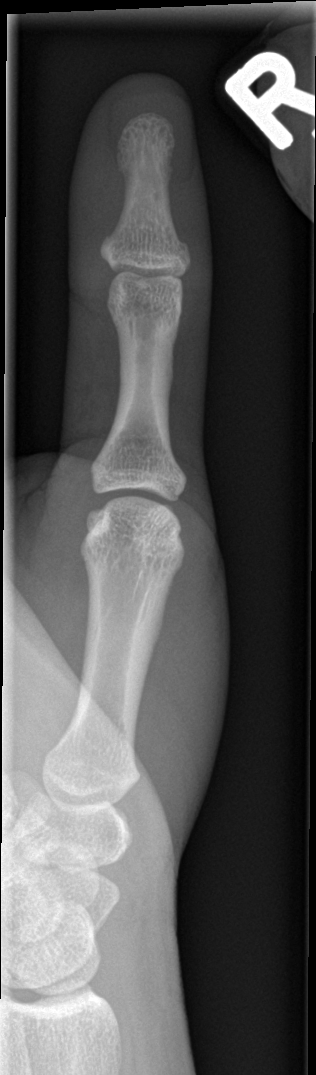

[finger obl]
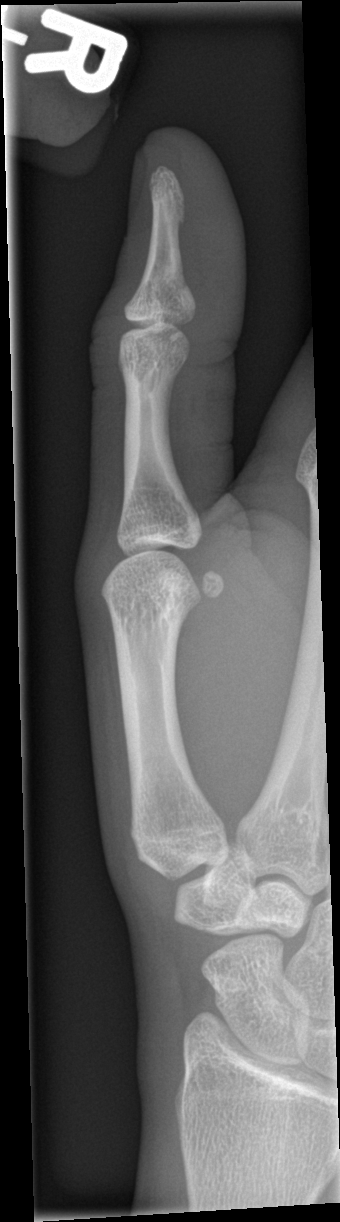

[finger lat]
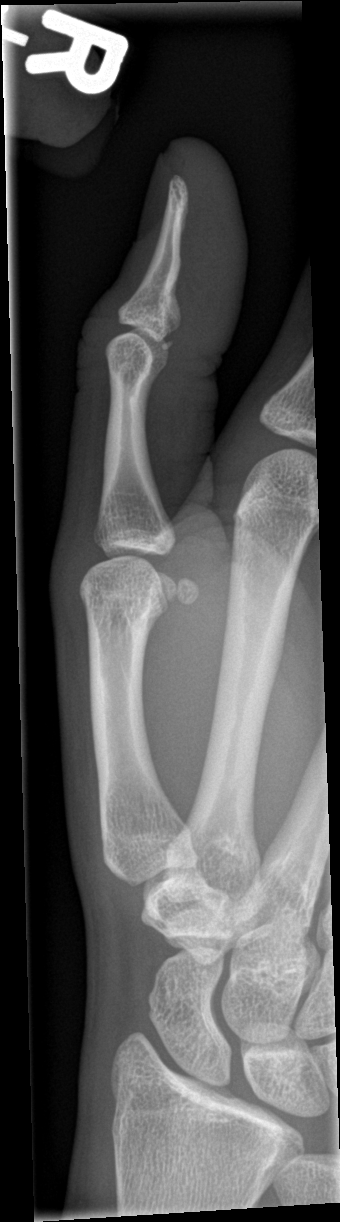

[3 of 3 positions shown; findings below may reference images not displayed]

FINDINGS: There is a tiny density along the ventral aspect of the first
interphalangeal joint. This is favored as chronic, although a small
acute avulsion fracture can not be entirely excluded. There are no
other areas suspicious for acute fracture. Joint spaces are well
maintained soft tissues are within normal limits.
IMPRESSION: 1. Can not exclude tiny avulsion fracture along the first ventral
aspect of the interphalangeal joint. Please correlate for point
tenderness.

## 2022-05-17 ENCOUNTER — Other Ambulatory Visit: Payer: Self-pay

## 2022-05-17 ENCOUNTER — Emergency Department (HOSPITAL_BASED_OUTPATIENT_CLINIC_OR_DEPARTMENT_OTHER): Payer: Self-pay

## 2022-05-17 ENCOUNTER — Emergency Department (HOSPITAL_BASED_OUTPATIENT_CLINIC_OR_DEPARTMENT_OTHER)
Admission: EM | Admit: 2022-05-17 | Discharge: 2022-05-18 | Disposition: A | Discharge status: Home / Self Care | Payer: Self-pay | Attending: Emergency Medicine | Admitting: Emergency Medicine

## 2022-05-17 ENCOUNTER — Encounter (HOSPITAL_BASED_OUTPATIENT_CLINIC_OR_DEPARTMENT_OTHER): Payer: Self-pay | Admitting: Emergency Medicine

## 2022-05-17 DIAGNOSIS — R0789 Other chest pain: Secondary | ICD-10-CM | POA: Insufficient documentation

## 2022-05-17 LAB — CBC
HCT: 39.3 % (ref 36.0–46.0)
Hemoglobin: 12.7 g/dL (ref 12.0–15.0)
MCH: 28.2 pg (ref 26.0–34.0)
MCHC: 32.3 g/dL (ref 30.0–36.0)
MCV: 87.3 fL (ref 80.0–100.0)
Platelets: 131 10*3/uL — ABNORMAL LOW (ref 150–400)
RBC: 4.5 MIL/uL (ref 3.87–5.11)
RDW: 14 % (ref 11.5–15.5)
WBC: 8.2 10*3/uL (ref 4.0–10.5)
nRBC: 0 % (ref 0.0–0.2)

## 2022-05-17 LAB — BASIC METABOLIC PANEL
Anion gap: 6 (ref 5–15)
BUN: 6 mg/dL (ref 6–20)
CO2: 23 mmol/L (ref 22–32)
Calcium: 9 mg/dL (ref 8.9–10.3)
Chloride: 108 mmol/L (ref 98–111)
Creatinine, Ser: 0.68 mg/dL (ref 0.44–1.00)
GFR, Estimated: >60 mL/min (ref >60)
Glucose, Bld: 90 mg/dL (ref 70–99)
Potassium: 3.4 mmol/L — ABNORMAL LOW (ref 3.5–5.1)
Sodium: 137 mmol/L (ref 135–145)

## 2022-05-17 LAB — TROPONIN I (HIGH SENSITIVITY): Troponin I (High Sensitivity): <2 ng/L (ref <18)

## 2022-05-17 NOTE — Discharge Instructions (Signed)
Your work-up today was very reassuring, however your chest pain continues I would like you to follow-up with cardiology.  I placed a referral for you here and they should be calling you either Monday or Tuesday.  It is possible that your symptoms were triggered from severe stress or anxiety.  I have also put in a referral to [facility] for you to establish primary care as needed.  If you require anxiety medications in the future, you can follow-up with them.  You will need to call their office directly.

## 2022-05-17 NOTE — ED Provider Notes (Signed)
[facility] EMERGENCY DEPARTMENT Provider Note   CSN: [id number] Arrival date & time: 05/17/22  2038     History  Chief Complaint  Patient presents with   Chest Pain    Monica Mayer is a 26 y.o. female previously healthy who presents emergency department for evaluation of left-sided chest pain that started around 6 hours prior to arrival.  She states pain began in the left side of her chest and radiated down her left arm.  No alleviating or aggravating factors.  She notes pain continued until after she left the x-ray room here in the emergency department.  No medical intervention.  Patient is a non-smoker although she vapes.  She states that she had 3 bottles of water today and some food.  She was under increased stress today and had several episodes that were increasing her anxiety.  She has no previous history of diagnosed anxiety has not had any medications.  She denies headache, vision changes, abdominal pain, nausea, vomiting and diarrhea.  Chest Pain Associated symptoms: no abdominal pain, no dizziness, no palpitations and no vomiting        Home Medications Prior to Admission medications   Medication Sig Start Date End Date Taking? Authorizing Provider  doxycycline (VIBRA-TABS) 100 MG tablet Take 1 tablet (100 mg total) by mouth 2 (two) times daily. Patient not taking: Reported on 04/16/2021 01/05/21   Elson Areas, PA-C  methocarbamol (ROBAXIN) 500 MG tablet Take 1 tablet (500 mg total) by mouth 2 (two) times daily. Patient not taking: Reported on 04/16/2021 03/12/19   Aviva Kluver B, PA-C  naproxen (NAPROSYN) 375 MG tablet Take 1 tablet (375 mg total) by mouth 2 (two) times daily. Patient not taking: Reported on 04/16/2021 03/12/19   Aviva Kluver B, PA-C      Allergies    No known allergies    Review of Systems   Review of Systems  Cardiovascular:  Positive for chest pain. Negative for palpitations and leg swelling.  Gastrointestinal:  Negative for abdominal  pain and vomiting.  Neurological:  Negative for dizziness.    Physical Exam Updated Vital Signs BP 126/85   Pulse 70   Temp 98.8 F (37.1 C) (Oral)   Resp 18   Wt 61.2 kg   LMP 04/17/2022 (Approximate)   SpO2 100%   BMI 24.69 kg/m  Physical Exam Vitals and nursing note reviewed.  Constitutional:      General: She is not in acute distress.    Appearance: She is not ill-appearing.  HENT:     Head: Atraumatic.  Eyes:     Conjunctiva/sclera: Conjunctivae normal.  Neck:     Vascular: No JVD.  Cardiovascular:     Rate and Rhythm: Normal rate and regular rhythm.     Pulses: Normal pulses.          Radial pulses are 2+ on the right side and 2+ on the left side.       Dorsalis pedis pulses are 2+ on the right side and 2+ on the left side.     Heart sounds: No murmur heard. Pulmonary:     Effort: Pulmonary effort is normal. No respiratory distress.     Breath sounds: Normal breath sounds.  Abdominal:     General: Abdomen is flat. There is no distension.     Palpations: Abdomen is soft.     Tenderness: There is no abdominal tenderness.  Musculoskeletal:        General: Normal range of motion.  Cervical back: Normal range of motion.     Right lower leg: No edema.     Left lower leg: No edema.  Skin:    General: Skin is warm and dry.     Capillary Refill: Capillary refill takes less than 2 seconds.  Neurological:     General: No focal deficit present.     Mental Status: She is alert.  Psychiatric:        Mood and Affect: Mood normal.     ED Results / Procedures / Treatments   Labs (all labs ordered are listed, but only abnormal results are displayed) Labs Reviewed  BASIC METABOLIC PANEL - Abnormal; Notable for the following components:      Result Value   Potassium 3.4 (*)    All other components within normal limits  CBC - Abnormal; Notable for the following components:   Platelets 131 (*)    All other components within normal limits  PREGNANCY, URINE   TROPONIN I (HIGH SENSITIVITY)  TROPONIN I (HIGH SENSITIVITY)    EKG EKG Interpretation  Date/Time:  Friday May 17 2022 20:50:40 EDT Ventricular Rate:  81 PR Interval:  154 QRS Duration: 74 QT Interval:  364 QTC Calculation: 422 R Axis:   74 Text Interpretation: Normal sinus rhythm with sinus arrhythmia Confirmed by Nicanor Alcon, April ([id number]) on 05/17/2022 11:18:37 PM  Radiology DG Chest 2 View  Result Date: 05/17/2022 CLINICAL DATA:  Left chest pain. EXAM: CHEST - 2 VIEW COMPARISON:  Chest x-ray with ribs 03/12/2019 FINDINGS: The heart size and mediastinal contours are within normal limits. Both lungs are clear. The visualized skeletal structures are unremarkable. IMPRESSION: No active cardiopulmonary disease. Electronically Signed   By: Darliss Cheney M.D.   On: 05/17/2022 21:03    Procedures Procedures    Medications Ordered in ED Medications - No data to display  ED Course/ Medical Decision Making/ A&P Clinical Course as of 05/17/22 2353  Fri May 17, 2022  2313 DG Chest 2 View [EC]    Clinical Course User Index [EC] Janell Quiet, [place]           HEART Score: 0                Medical Decision Making Amount and/or Complexity of Data Reviewed Labs: ordered. Radiology: ordered. Decision-making details documented in ED Course.   Social determinants of health:  Social History   Socioeconomic History   Marital status: Single    Spouse name: Not on file   Number of children: Not on file   Years of education: Not on file   Highest education level: Not on file  Occupational History   Not on file  Tobacco Use   Smoking status: Never   Smokeless tobacco: Never  Vaping Use   Vaping Use: Every day  Substance and Sexual Activity   Alcohol use: Yes    Comment: occ   Drug use: Yes    Types: Marijuana   Sexual activity: Yes    Birth control/protection: None  Other Topics Concern   Not on file  Social History Narrative   Not on file   Social Determinants of  Health   Financial Resource Strain: Not on file  Food Insecurity: Not on file  Transportation Needs: Not on file  Physical Activity: Not on file  Stress: Not on file  Social Connections: Not on file  Intimate Partner Violence: Not on file     Initial impression:  This patient presents to the ED  for concern of chest pain, this involves an extensive number of treatment options, and is a complaint that carries with it a high risk of complications and morbidity.   Differentials include ACS, anxiety, pneumonia, viral URI, costochondritis  Comorbidities affecting care:  None  Additional history obtained: Boyfriend  Lab Tests  I Ordered, reviewed, and interpreted labs and EKG.  The pertinent results include:  BMP, CBC and troponin normal  Imaging Studies ordered:  I ordered imaging studies including  Chest x-ray without acute findings I independently visualized and interpreted imaging and I agree with the radiologist interpretation.   EKG: Normal sinus rhythm  Cardiac Monitoring:  The patient was maintained on a cardiac monitor.  I personally viewed and interpreted the cardiac monitored which showed an underlying rhythm of: Normal sinus rhythm    ED Course/Re-evaluation: 27 year old female presents to the ED for evaluation of left-sided chest pain that began this afternoon.  Vitals are without significant abnormality.  Patient is in no acute distress.  She is nondiaphoretic.  No reproducible chest wall tenderness.  Lungs CTA bilaterally.  Abdomen soft, nondistended.  Labs were normal, EKG normal.  Chest x-ray without acute findings.  Overall, patient's work-up here in the emergency department is very reassuring.  However she states that she continues to feel a small ache near her left shoulder.  Patient's heart score is 0.  It is possible that she is suffering from a physical manifestation of anxiety given that she was under increased stress earlier today.  Regardless, referral to  cardiology placed.  She was also given referral to [facility] health and wellness should she continue to experience symptoms of anxiety.  Patient expresses understanding is amenable to plan.  Disposition:  After consideration of the diagnostic results, physical exam, history and the patients response to treatment feel that the patent would benefit from discharge.   Atypical chest pain: Plan and management as described above. Discharged home in good condition. Final Clinical Impression(s) / ED Diagnoses Final diagnoses:  Atypical chest pain    Rx / DC Orders ED Discharge Orders          Ordered    Ambulatory referral to Cardiology       Comments: If you have not heard from the Cardiology office within the next 72 hours please call [phone].   05/17/22 2345              Delight Ovens 05/17/22 2353    Benjiman Core, MD 05/17/22 2358

## 2022-05-17 NOTE — ED Triage Notes (Signed)
Left sided chest pain that started around 430 pm today. Pain has subsided, but left arm is now numb and tingling.

## 2022-05-18 NOTE — ED Notes (Signed)
Pt. Reports she thinks she had a panic attack today and it made her chest hurt.  Pt. In no distress at present time and no shob noted.Marland Kitchen

## 2023-10-10 ENCOUNTER — Inpatient Hospital Stay (HOSPITAL_COMMUNITY): Payer: Medicaid Other

## 2023-10-10 ENCOUNTER — Inpatient Hospital Stay (HOSPITAL_COMMUNITY)
Admission: AD | Admit: 2023-10-10 | Discharge: 2023-10-10 | Disposition: A | Discharge status: Home / Self Care | Payer: Medicaid Other | Attending: Obstetrics and Gynecology | Admitting: Obstetrics and Gynecology

## 2023-10-10 ENCOUNTER — Encounter (HOSPITAL_COMMUNITY): Payer: Self-pay | Admitting: Obstetrics and Gynecology

## 2023-10-10 DIAGNOSIS — N8312 Corpus luteum cyst of left ovary: Secondary | ICD-10-CM | POA: Diagnosis not present

## 2023-10-10 DIAGNOSIS — O99891 Other specified diseases and conditions complicating pregnancy: Secondary | ICD-10-CM | POA: Insufficient documentation

## 2023-10-10 DIAGNOSIS — O3481 Maternal care for other abnormalities of pelvic organs, first trimester: Secondary | ICD-10-CM | POA: Insufficient documentation

## 2023-10-10 DIAGNOSIS — Z3A01 Less than 8 weeks gestation of pregnancy: Secondary | ICD-10-CM | POA: Diagnosis not present

## 2023-10-10 DIAGNOSIS — K921 Melena: Secondary | ICD-10-CM | POA: Insufficient documentation

## 2023-10-10 DIAGNOSIS — K625 Hemorrhage of anus and rectum: Secondary | ICD-10-CM

## 2023-10-10 DIAGNOSIS — R109 Unspecified abdominal pain: Secondary | ICD-10-CM | POA: Diagnosis not present

## 2023-10-10 DIAGNOSIS — Z3201 Encounter for pregnancy test, result positive: Secondary | ICD-10-CM | POA: Diagnosis not present

## 2023-10-10 DIAGNOSIS — O26891 Other specified pregnancy related conditions, first trimester: Secondary | ICD-10-CM | POA: Insufficient documentation

## 2023-10-10 LAB — ABO/RH: ABO/RH(D): A POS

## 2023-10-10 LAB — CBC
HCT: 41.1 % (ref 36.0–46.0)
Hemoglobin: 13.3 g/dL (ref 12.0–15.0)
MCH: 28 pg (ref 26.0–34.0)
MCHC: 32.4 g/dL (ref 30.0–36.0)
MCV: 86.5 fL (ref 80.0–100.0)
Platelets: 166 10*3/uL (ref 150–400)
RBC: 4.75 MIL/uL (ref 3.87–5.11)
RDW: 14.1 % (ref 11.5–15.5)
WBC: 7.7 10*3/uL (ref 4.0–10.5)
nRBC: 0 % (ref 0.0–0.2)

## 2023-10-10 LAB — COMPREHENSIVE METABOLIC PANEL
ALT: 16 U/L (ref 0–44)
AST: 22 U/L (ref 15–41)
Albumin: 3.8 g/dL (ref 3.5–5.0)
Alkaline Phosphatase: 46 U/L (ref 38–126)
Anion gap: 11 (ref 5–15)
BUN: 6 mg/dL (ref 6–20)
CO2: 22 mmol/L (ref 22–32)
Calcium: 9.4 mg/dL (ref 8.9–10.3)
Chloride: 102 mmol/L (ref 98–111)
Creatinine, Ser: 0.59 mg/dL (ref 0.44–1.00)
GFR, Estimated: >60 mL/min (ref >60)
Glucose, Bld: 95 mg/dL (ref 70–99)
Potassium: 4.1 mmol/L (ref 3.5–5.1)
Sodium: 135 mmol/L (ref 135–145)
Total Bilirubin: 0.4 mg/dL (ref <1.2)
Total Protein: 7.5 g/dL (ref 6.5–8.1)

## 2023-10-10 LAB — OB RESULTS CONSOLE RUBELLA ANTIBODY, IGM: Rubella: IMMUNE

## 2023-10-10 LAB — OB RESULTS CONSOLE HIV ANTIBODY (ROUTINE TESTING): HIV: NONREACTIVE

## 2023-10-10 LAB — HCG, QUANTITATIVE, PREGNANCY: hCG, Beta Chain, Quant, S: 139309 m[IU]/mL — ABNORMAL HIGH (ref <5)

## 2023-10-10 LAB — OB RESULTS CONSOLE HEPATITIS B SURFACE ANTIGEN: Hepatitis B Surface Ag: NEGATIVE

## 2023-10-10 LAB — OB RESULTS CONSOLE RPR: RPR: NONREACTIVE

## 2023-10-10 LAB — POCT PREGNANCY, URINE: Preg Test, Ur: POSITIVE — AB

## 2023-10-10 MED ORDER — VITAFOL GUMMIES 3.33-0.333-34.8 MG PO CHEW
1.0000 | CHEWABLE_TABLET | Freq: Every day | ORAL | 5 refills | Status: AC
Start: 2023-10-10 — End: ?

## 2023-10-10 NOTE — MAU Provider Note (Signed)
Chief Complaint: Abdominal Pain and Blood In Stools  SUBJECTIVE HPI: Monica Mayer is a 27 y.o. G1P0 at Unknown by LMP who presents to maternity admissions reporting stabbing abdominal pain every time she walks and dark brown scant rectal bleeding (not requiring a pad) since Sunday with associated diarrhea. Hx of similar rectal bleeding 2 years ago that was attributed to fissures. Denies vaginal bleeding. Does not think that she is pregnant, and does not desire to be.  She denies vaginal bleeding, vaginal itching/burning, urinary symptoms, h/a, dizziness, n/v, or fever/chills.    HPI  No past medical history on file. Past Surgical History:  Procedure Laterality Date   FRACTURE SURGERY     Right arm fracture repair   Social History   Socioeconomic History   Marital status: Single    Spouse name: Not on file   Number of children: Not on file   Years of education: Not on file   Highest education level: Not on file  Occupational History   Not on file  Tobacco Use   Smoking status: Never   Smokeless tobacco: Never  Vaping Use   Vaping status: Every Day  Substance and Sexual Activity   Alcohol use: Yes    Comment: occ   Drug use: Yes    Types: Marijuana   Sexual activity: Yes    Birth control/protection: None  Other Topics Concern   Not on file  Social History Narrative   Not on file   Social Determinants of Health   Financial Resource Strain: Not on file  Food Insecurity: Not on file  Transportation Needs: Not on file  Physical Activity: Not on file  Stress: Not on file  Social Connections: Unknown (03/26/2022)   [facility] from [facility]   Social Network    Social Network: Not on file  Intimate Partner Violence: Unknown (02/14/2022)   Received from [facility], [facility]   HITS    Physically Hurt: Not on file    Insult or Talk Down To: Not on file    Threaten Physical Harm: Not on file    Scream or Curse: Not on file   No current  facility-administered medications on file prior to encounter.   Current Outpatient Medications on File Prior to Encounter  Medication Sig Dispense Refill   doxycycline (VIBRA-TABS) 100 MG tablet Take 1 tablet (100 mg total) by mouth 2 (two) times daily. (Patient not taking: Reported on 04/16/2021) 20 tablet 0   methocarbamol (ROBAXIN) 500 MG tablet Take 1 tablet (500 mg total) by mouth 2 (two) times daily. (Patient not taking: Reported on 04/16/2021) 10 tablet 0   naproxen (NAPROSYN) 375 MG tablet Take 1 tablet (375 mg total) by mouth 2 (two) times daily. (Patient not taking: Reported on 04/16/2021) 20 tablet 0   Allergies  Allergen Reactions   No Known Allergies     I have reviewed patient's Past Medical Hx, Surgical Hx, Family Hx, Social Hx, medications and allergies.   ROS:  Review of Systems Review of Systems  Other systems negative   Physical Exam  Physical Exam Patient Vitals for the past 24 hrs:  BP Temp Temp src Pulse Resp SpO2 Height Weight  10/10/23 1945 123/68 98.5 F (36.9 C) Oral 73 17 100 % 5\' 2"  (1.575 m) 64.6 kg   Constitutional: Well-developed, well-nourished female in no acute distress.  Cardiovascular: normal rate Respiratory: normal effort GI: Abd soft, non-tender. Pos BS x 4 MS: Extremities nontender, no edema, normal ROM Neurologic:  Alert and oriented x 4.  GU: Neg CVAT.  LAB RESULTS Results for orders placed or performed during the hospital encounter of 10/10/23 (from the past 24 hour(s))  Pregnancy, urine POC     Status: Abnormal   Collection Time: 10/10/23  8:02 PM  Result Value Ref Range   Preg Test, Ur POSITIVE (A) NEGATIVE  CBC     Status: None   Collection Time: 10/10/23  9:09 PM  Result Value Ref Range   WBC 7.7 4.0 - 10.5 K/uL   RBC 4.75 3.87 - 5.11 MIL/uL   Hemoglobin 13.3 12.0 - 15.0 g/dL   HCT 82.9 56.2 - 13.0 %   MCV 86.5 80.0 - 100.0 fL   MCH 28.0 26.0 - 34.0 pg   MCHC 32.4 30.0 - 36.0 g/dL   RDW 86.5 78.4 - 69.6 %   Platelets 166 150  - 400 K/uL   nRBC 0.0 0.0 - 0.2 %  Comprehensive metabolic panel     Status: None   Collection Time: 10/10/23  9:09 PM  Result Value Ref Range   Sodium 135 135 - 145 mmol/L   Potassium 4.1 3.5 - 5.1 mmol/L   Chloride 102 98 - 111 mmol/L   CO2 22 22 - 32 mmol/L   Glucose, Bld 95 70 - 99 mg/dL   BUN 6 6 - 20 mg/dL   Creatinine, Ser 2.95 0.44 - 1.00 mg/dL   Calcium 9.4 8.9 - 28.4 mg/dL   Total Protein 7.5 6.5 - 8.1 g/dL   Albumin 3.8 3.5 - 5.0 g/dL   AST 22 15 - 41 U/L   ALT 16 0 - 44 U/L   Alkaline Phosphatase 46 38 - 126 U/L   Total Bilirubin 0.4 <1.2 mg/dL   GFR, Estimated >13 >24 mL/min   Anion gap 11 5 - 15  hCG, quantitative, pregnancy     Status: Abnormal   Collection Time: 10/10/23  9:09 PM  Result Value Ref Range   hCG, Beta Chain, Quant, S 139,309 (H) <5 mIU/mL  ABO/Rh     Status: None   Collection Time: 10/10/23  9:10 PM  Result Value Ref Range   ABO/RH(D) A POS    No rh immune globuloin      NOT A RH IMMUNE GLOBULIN CANDIDATE, PT RH POSITIVE Performed at [facility], [address] [place], [place] [address]     --/--/A POS (11/29 2110)  IMAGING US OB LESS THAN 14 WEEKS WITH OB TRANSVAGINAL  Result Date: 10/10/2023 CLINICAL DATA:  Initial evaluation for early pregnancy, unknown location EXAM: OBSTETRIC <14 WK [place] AND TRANSVAGINAL OB US TECHNIQUE: Both transabdominal and transvaginal ultrasound examinations were performed for complete evaluation of the gestation as well as the maternal uterus, adnexal regions, and pelvic cul-de-sac. Transvaginal technique was performed to assess early pregnancy. COMPARISON:  None Available. FINDINGS: Intrauterine gestational sac: Single Yolk sac:  Present Embryo:  Present Cardiac Activity: Present Heart Rate: 133 bpm CRL:  11.2 mm   7 w   1 d                  [place] EDC: 05/27/2024 Subchorionic hemorrhage:  None visualized. Maternal uterus/adnexae: Ovaries within normal limits. Corpus luteal cyst noted on the left. Small volume  free fluid within the pelvis. IMPRESSION: 1. Single viable IUP, estimated gestational age [redacted] weeks and 1 day by crown-rump length, with ultrasound EDC of 05/27/2024. No complication. 2. Left ovarian corpus luteal cyst with associated small volume free  fluid within the pelvis. Electronically Signed   By: Rise Mu M.D.   On: 10/10/2023 22:01    MAU Management/MDM: I have reviewed the triage vital signs and the nursing notes.   Pertinent labs & imaging results that were available during my care of the patient were reviewed by me and considered in my medical decision making (see chart for details).      I have reviewed her medical records including past results, notes and treatments. Medical, Surgical, and family history were reviewed.  Medications and recent lab tests were reviewed  Ordered usual first trimester r/o ectopic labs.   Pelvic exam and cultures done Will check baseline Ultrasound to rule out ectopic.  Treatments in MAU included CBC, bHCG, TVUS, .   This bleeding/pain can represent a normal pregnancy with bleeding, spontaneous abortion or even an ectopic which can be life-threatening.  The process as listed above helps to determine which of these is present.  ASSESSMENT 1. [redacted] weeks gestation of pregnancy   2. Rectal bleeding   SIUP with FHR 133 measuring 7 weeks  Mild dark red spotting per rectum, hx of the same secondary to anal fissures. Exam offered, declined.   PLAN Discharge home Establish with OB/GYN of choice for routine prenatal care - list provided Safe medications in pregnancy list provided Return precautions provided   Follow-up Information     [facility] Follow up.   Specialty: Obstetrics and Gynecology Why: For obstetrical emergencies: heavy vaginal bleeding, fever/chills, intolerable cramping Contact information: [address] [place] [address] [phone]              Pt stable at time of  discharge. Encouraged to return here if she develops worsening of symptoms, increase in pain, fever, or other concerning symptoms.   Wyn Forster, MD FMOB Fellow, Faculty practice [facility], Center for [facility]  10/10/2023  11:15 PM

## 2023-10-10 NOTE — MAU Note (Signed)
..  Monica Mayer is a 27 y.o. at Unknown here in MAU reporting: reports abdominal pain, feels like she is getting stabbed every time she walks. Reports blood in stool that began on Sunday along with diarrhea. Had this happen two years ago and was told she had fissures. Denies vaginal bleeding. Does not think she is pregnant.  LMP: over a month ago  Pain score: 7/10 Vitals:   10/10/23 1945  BP: 123/68  Pulse: 73  Resp: 17  Temp: 98.5 F (36.9 C)  SpO2: 100%     FHT:n/a Lab orders placed from triage:  POCT pregnancy test

## 2023-10-10 NOTE — Discharge Instructions (Signed)

## 2023-11-08 ENCOUNTER — Inpatient Hospital Stay (HOSPITAL_COMMUNITY)
Admission: AD | Admit: 2023-11-08 | Discharge: 2023-11-08 | Disposition: A | Discharge status: Home / Self Care | Payer: Medicaid Other | Attending: Obstetrics & Gynecology | Admitting: Obstetrics & Gynecology

## 2023-11-08 DIAGNOSIS — Z3401 Encounter for supervision of normal first pregnancy, first trimester: Secondary | ICD-10-CM | POA: Diagnosis present

## 2023-11-08 DIAGNOSIS — Z711 Person with feared health complaint in whom no diagnosis is made: Secondary | ICD-10-CM | POA: Diagnosis not present

## 2023-11-08 DIAGNOSIS — Z3A11 11 weeks gestation of pregnancy: Secondary | ICD-10-CM | POA: Insufficient documentation

## 2023-11-08 NOTE — MAU Provider Note (Signed)
Chief Complaint:  Follow-up   HPI   Event Date/Time   First Provider Initiated Contact with Patient 11/08/23 1421      Monica Mayer is a 27 y.o. G1P0 at [redacted]w[redacted]d who presents to maternity admissions reporting she wants to check up on the baby. She reports she has not been seen since she found out she was pregnant. She was scared something might be wrong with not having OB care. Patient reports she recently received her medicaid and can now schedule an appointment with an OB/GYN. Patient declines any bleeding vaginal discharge or abdominal pain. Patient is accompanied by her significant other and he is contributing to her care.     No past medical history on file. OB History  Gravida Para Term Preterm AB Living  1       SAB IAB Ectopic Multiple Live Births          # Outcome Date GA Lbr Len/2nd Weight Sex Type Anes PTL Lv  1 Current            Past Surgical History:  Procedure Laterality Date   FRACTURE SURGERY     Right arm fracture repair   No family history on file. Social History   Tobacco Use   Smoking status: Never   Smokeless tobacco: Never  Vaping Use   Vaping status: Every Day  Substance Use Topics   Alcohol use: Yes    Comment: occ   Drug use: Yes    Types: Marijuana   Allergies  Allergen Reactions   No Known Allergies    Medications Prior to Admission  Medication Sig Dispense Refill Last Dose/Taking   Prenatal Vit-Fe Phos-FA-Omega (VITAFOL GUMMIES) 3.33-0.333-34.8 MG CHEW Chew 1 tablet by mouth daily. 90 tablet 5     I have reviewed patient's Past Medical Hx, Surgical Hx, Family Hx, Social Hx, medications and allergies.   ROS  Pertinent items noted in HPI and remainder of comprehensive ROS otherwise negative.   PHYSICAL EXAM  Patient Vitals for the past 24 hrs:  BP Temp Temp src Pulse Resp SpO2 Height Weight  11/08/23 1355 113/73 98.9 F (37.2 C) Oral 88 16 100 % 5\' 2"  (1.575 m) 61.2 kg    Constitutional: Well-developed, well-nourished female in  no acute distress.  Cardiovascular: normal rate & rhythm, warm and well-perfused Respiratory: normal effort, no problems with respiration noted GI: Abd soft, non-tender, non-distended MS: Extremities nontender, no edema, normal ROM Neurologic: Alert and oriented x 4.  GU: no CVA tenderness Pelvic: NEFG, physiologic discharge, no blood      Fetal Heart Tones: Doppler 156   Labs: No results found for this or any previous visit (from the past 24 hours).  Imaging:  No results found.  MDM & MAU COURSE  MDM:  MAU Course: No orders of the defined types were placed in this encounter.  No orders of the defined types were placed in this encounter.   ASSESSMENT   1. Encounter for supervision of normal first pregnancy in first trimester   2. Worried well  Provided reassurance for pregnancy course. Reviewed normal pregnancy discomforts.  3. [redacted] weeks gestation of pregnancy  Patient to follow up with OBGYN of choice to establish care. Provided list of OBGYNs in the triad.     PLAN  Discharge home in stable condition with return precautions. May return to MAU as needed   Follow-up Information     Center for [facility] at [facility]. Call.  Specialty: Obstetrics and Gynecology Why: call on monday to establish care Contact information: [address] [place] [address] [phone]                Allergies as of 11/08/2023       Reactions   No Known Allergies         Medication List     TAKE these medications    Vitafol Gummies 3.33-0.333-34.8 MG Chew Chew 1 tablet by mouth daily.      Dia Sitter [facility]

## 2023-11-08 NOTE — MAU Note (Addendum)
...  Monica Mayer is a 27 y.o. at [redacted]w[redacted]d here in MAU reporting: Desires a check up as she has not been seen for over one month. She denies VB, abnormal discharge, and current pain. She reports after working she will occasionally have pelvic pain.  Just found out yesterday that her Medicaid was approved on 12/1 but was unaware. Desires to establish [facility].   Pain score: Denies pain.  Vitals:   11/08/23 1355  BP: 113/73  Pulse: 88  Resp: 16  Temp: 98.9 F (37.2 C)  SpO2: 100%      FHT: 156 doppler Lab orders placed from triage: none

## 2023-11-12 NOTE — L&D Delivery Note (Addendum)
 OB/GYN [facility] Delivery Note  Chaquetta Schlottman is a 28 y.o. G1P0 s/p VD at [redacted]w[redacted]d. She was admitted for PTL.   ROM: 0h 3m with clear to yellow fluid GBS Status:  PRESUMPTIVE NEGATIVE/-- (04/10 0144) Maximum Maternal Temperature: 99.54F  Labor Progress: Initial SVE: 3/50/bulging membranes. She then progressed to complete.   Delivery Date/Time: 02/21/2024 @0352  Delivery: Called to room and patient was complete and pushing. The mother coughed forcefully and the head, shoulder and body delivered quickly and simultaneously without issue. There was a loose nuchal cord present x1. The female infant with spontaneous cry allowed to rest at the perineum during delayed cord clapping. The cord was clamped x 2 after 30-second delay, and cut by FOB. The baby was handed off to the waiting NICU team. Cord gases and cord blood were drawn. The placenta delivered spontaneously with gentle fundal massage; no traction was applied to the cord. The fundus was firm with massage and Pitocin. The labia, perineum, vagina, and cervix inspected. There were no laceration to repair and hemostasis confirmed. Baby Weight: pending  Placenta: 3 vessel, intact. Sent to pathology or with the patient (if she planned to keep it) Complications: None Lacerations: None EBL: 175 mL Analgesia: Epidural   Infant:  APGAR (1 MIN):  Pending per NICU APGAR (5 MINS):  Pending per NICU  Lekeisha Arenas Autry-Lott, DO 02/21/2024, 4:15 AM

## 2024-01-12 ENCOUNTER — Encounter (HOSPITAL_BASED_OUTPATIENT_CLINIC_OR_DEPARTMENT_OTHER): Payer: Self-pay

## 2024-01-12 ENCOUNTER — Other Ambulatory Visit: Payer: Self-pay

## 2024-01-12 DIAGNOSIS — K0889 Other specified disorders of teeth and supporting structures: Secondary | ICD-10-CM | POA: Insufficient documentation

## 2024-01-12 DIAGNOSIS — Z5321 Procedure and treatment not carried out due to patient leaving prior to being seen by health care provider: Secondary | ICD-10-CM | POA: Insufficient documentation

## 2024-01-12 NOTE — ED Triage Notes (Signed)
 Reports 5months OB

## 2024-01-12 NOTE — ED Triage Notes (Signed)
 Pt presents via POV c/o left sided dental pain starting today. Ambulatory to triage. A&O x4.

## 2024-01-13 ENCOUNTER — Ambulatory Visit (HOSPITAL_COMMUNITY)
Admission: EM | Admit: 2024-01-13 | Discharge: 2024-01-13 | Disposition: A | Discharge status: Home / Self Care | Attending: Physician Assistant | Admitting: Physician Assistant

## 2024-01-13 ENCOUNTER — Emergency Department (HOSPITAL_BASED_OUTPATIENT_CLINIC_OR_DEPARTMENT_OTHER)
Admission: EM | Admit: 2024-01-13 | Discharge: 2024-01-13 | Discharge status: Against Medical Advice | Attending: Emergency Medicine | Admitting: Emergency Medicine

## 2024-01-13 ENCOUNTER — Encounter (HOSPITAL_COMMUNITY): Payer: Self-pay

## 2024-01-13 DIAGNOSIS — K029 Dental caries, unspecified: Secondary | ICD-10-CM

## 2024-01-13 DIAGNOSIS — K047 Periapical abscess without sinus: Secondary | ICD-10-CM

## 2024-01-13 DIAGNOSIS — K0889 Other specified disorders of teeth and supporting structures: Secondary | ICD-10-CM

## 2024-01-13 MED ORDER — AMOXICILLIN-POT CLAVULANATE 875-125 MG PO TABS: 1.0000 | ORAL_TABLET | Freq: Two times a day (BID) | ORAL | 0 refills | Status: DC

## 2024-01-13 NOTE — ED Provider Notes (Signed)
 [facility]    CSN: [id number] Arrival date & time: 01/13/24  8295      History   Chief Complaint Chief Complaint  Patient presents with   Dental Pain    HPI Monica Mayer is a 28 y.o. female.   HPI  Patient presents today with concerns for left lower dental pain since yesterday morning. She thinks this is related to her left lower wisdom tooth. She denies previous issues or concerns with this previously   Patient is [redacted] weeks pregnant which will need to remain in consideration for potential treatment plan  Patient reports that she did take a Motrin last night to help with pain.  She reports that she crush this and added it to a water bottle but did not drink the entire contents.   History reviewed. No pertinent past medical history.  There are no active problems to display for this patient.   Past Surgical History:  Procedure Laterality Date   FRACTURE SURGERY     Right arm fracture repair    OB History     Gravida  1   Para      Term      Preterm      AB      Living         SAB      IAB      Ectopic      Multiple      Live Births               Home Medications    Prior to Admission medications   Medication Sig Start Date End Date Taking? Authorizing Provider  amoxicillin-clavulanate (AUGMENTIN) 875-125 MG tablet Take 1 tablet by mouth every 12 (twelve) hours. 01/13/24  Yes Shadoe Bethel E, PA-C  Prenatal Vit-Fe Phos-FA-Omega (VITAFOL GUMMIES) 3.33-0.333-34.8 MG CHEW Chew 1 tablet by mouth daily. 10/10/23   Wyn Forster, MD    Family History History reviewed. No pertinent family history.  Social History Social History   Tobacco Use   Smoking status: Never   Smokeless tobacco: Never  Vaping Use   Vaping status: Former  Substance Use Topics   Alcohol use: Not Currently    Comment: occ   Drug use: Not Currently    Types: Marijuana     Allergies   No known allergies   Review of Systems Review of Systems   Constitutional:  Negative for chills, fatigue and fever.  HENT:  Positive for dental problem and facial swelling.      Physical Exam Triage Vital Signs ED Triage Vitals  Encounter Vitals Group     BP 01/13/24 1015 118/76     Systolic BP Percentile --      Diastolic BP Percentile --      Pulse Rate 01/13/24 1015 (!) 123     Resp 01/13/24 1015 16     Temp 01/13/24 1015 98.7 F (37.1 C)     Temp Source 01/13/24 1015 Oral     SpO2 01/13/24 1015 98 %     Weight --      Height 01/13/24 1015 5\' 2"  (1.575 m)     Head Circumference --      Peak Flow --      Pain Score 01/13/24 1016 10     Pain Loc --      Pain Education --      Exclude from Growth Chart --    No data found.  Updated Vital Signs BP 118/76 (  BP Location: Left Arm)   Pulse (!) 123   Temp 98.7 F (37.1 C) (Oral)   Resp 16   Ht 5\' 2"  (1.575 m)   SpO2 98%   BMI 24.69 kg/m   Visual Acuity Right Eye Distance:   Left Eye Distance:   Bilateral Distance:    Right Eye Near:   Left Eye Near:    Bilateral Near:     Physical Exam Vitals reviewed.  Constitutional:      General: She is awake.     Appearance: Normal appearance. She is well-developed and well-groomed. She is not ill-appearing or toxic-appearing.  HENT:     Head: Normocephalic and atraumatic.     Jaw: Tenderness present.     Comments: Patient has tenderness along the left jaw.  Minimal swelling and no appreciated lymphadenopathy.    Mouth/Throat:     Lips: Pink.     Mouth: Mucous membranes are moist.     Dentition: Abnormal dentition. Dental tenderness, gingival swelling, dental caries and dental abscesses present.     Pharynx: Oropharynx is clear. Uvula midline. No pharyngeal swelling, oropharyngeal exudate, posterior oropharyngeal erythema, uvula swelling or postnasal drip.     Tonsils: No tonsillar exudate.      Comments: There is some gingival swelling and gum tenderness at the last left lower molar on exam. Musculoskeletal:     Cervical  back: Normal range of motion and neck supple.  Neurological:     Mental Status: She is alert.  Psychiatric:        Behavior: Behavior is cooperative.      UC Treatments / Results  Labs (all labs ordered are listed, but only abnormal results are displayed) Labs Reviewed - No data to display  EKG   Radiology No results found.  Procedures Procedures (including critical care time)  Medications Ordered in UC Medications - No data to display  Initial Impression / Assessment and Plan / UC Course  I have reviewed the triage vital signs and the nursing notes.  Pertinent labs & imaging results that were available during my care of the patient were reviewed by me and considered in my medical decision making (see chart for details).      Final Clinical Impressions(s) / UC Diagnoses   Final diagnoses:  Dental infection  Pain, dental  Infected dental caries   Patient presents today with concerns for left lower molar dental pain and jaw pain.  Physical exam is notable for likely infected dental carry in the tooth in question.  Will send in Augmentin p.o. twice daily x 7 days to assist with management.  Reviewed that she should only take Tylenol as needed for pain management.  Reviewed that NSAIDs are contraindicated in pregnancy and she should not take these again.  Recommend using Biotene mouthwash and Orajel as needed for further management.  Resources of low-cost dentists were provided in after visit summary.  ED and return precautions were reviewed and provided in after visit summary as well.  Follow-up as needed for progressing or persistent symptoms    Discharge Instructions      At this time I suspect that you likely have an infected dental cavity.  I have sent in a medication called Augmentin for you to take twice per day for 7 days.  This will help treat the infection until you can be seen by a dentist to have it evaluated and potentially manage more definitively.  You can  take Tylenol as needed for pain management.  Please do not take any NSAIDs (this would include Motrin, ibuprofen, naproxen, Aleve, Excedrin, Advil) as these can be dangerous in pregnancy. You can use Biotene mouthwash as needed to help with soothing the area.  You can also try Orajel for topical relief. I have included a list of low-cost dental resources for you to explore to see if you can give seen by one of them or if they accept your insurance. If it anytime you start to notice swelling along the jaw, fever, increased pain, drainage that looks like pus, difficulty swallowing or drooling please go to the emergency room for further evaluation and management.     ED Prescriptions     Medication Sig Dispense Auth. Provider   amoxicillin-clavulanate (AUGMENTIN) 875-125 MG tablet Take 1 tablet by mouth every 12 (twelve) hours. 14 tablet Tarry Blayney E, PA-C      PDMP not reviewed this encounter.   Providence Crosby, PA-C 01/13/24 1154

## 2024-01-13 NOTE — ED Notes (Signed)
No answer when called to treatment room.  

## 2024-01-13 NOTE — ED Triage Notes (Signed)
 Patient here today with c/o left lower dental pain since yesterday morning. Patient went to the ED last night but did not stay due to the long wait. Patient is [redacted] weeks pregnant.

## 2024-01-13 NOTE — ED Notes (Signed)
 No answer when called to room

## 2024-01-13 NOTE — Discharge Instructions (Signed)
 At this time I suspect that you likely have an infected dental cavity.  I have sent in a medication called Augmentin for you to take twice per day for 7 days.  This will help treat the infection until you can be seen by a dentist to have it evaluated and potentially manage more definitively.  You can take Tylenol as needed for pain management.  Please do not take any NSAIDs (this would include Motrin, ibuprofen, naproxen, Aleve, Excedrin, Advil) as these can be dangerous in pregnancy. You can use Biotene mouthwash as needed to help with soothing the area.  You can also try Orajel for topical relief. I have included a list of low-cost dental resources for you to explore to see if you can give seen by one of them or if they accept your insurance. If it anytime you start to notice swelling along the jaw, fever, increased pain, drainage that looks like pus, difficulty swallowing or drooling please go to the emergency room for further evaluation and management.

## 2024-02-18 ENCOUNTER — Encounter (HOSPITAL_COMMUNITY): Payer: Self-pay | Admitting: Obstetrics & Gynecology

## 2024-02-18 ENCOUNTER — Inpatient Hospital Stay (HOSPITAL_COMMUNITY)
Admission: AD | Admit: 2024-02-18 | Discharge: 2024-02-23 | DRG: 807 | Disposition: A | Discharge status: Home / Self Care | Attending: Family Medicine | Admitting: Family Medicine

## 2024-02-18 DIAGNOSIS — O9902 Anemia complicating childbirth: Secondary | ICD-10-CM | POA: Diagnosis present

## 2024-02-18 DIAGNOSIS — O26893 Other specified pregnancy related conditions, third trimester: Secondary | ICD-10-CM | POA: Diagnosis present

## 2024-02-18 DIAGNOSIS — Z3A25 25 weeks gestation of pregnancy: Secondary | ICD-10-CM

## 2024-02-18 DIAGNOSIS — Z3A26 26 weeks gestation of pregnancy: Secondary | ICD-10-CM

## 2024-02-18 DIAGNOSIS — D509 Iron deficiency anemia, unspecified: Secondary | ICD-10-CM | POA: Diagnosis present

## 2024-02-18 DIAGNOSIS — Z23 Encounter for immunization: Secondary | ICD-10-CM | POA: Diagnosis not present

## 2024-02-18 HISTORY — DX: Other specified health status: Z78.9

## 2024-02-18 LAB — CBC
HCT: 35.1 % — ABNORMAL LOW (ref 36.0–46.0)
Hemoglobin: 11.4 g/dL — ABNORMAL LOW (ref 12.0–15.0)
MCH: 28.6 pg (ref 26.0–34.0)
MCHC: 32.5 g/dL (ref 30.0–36.0)
MCV: 88.2 fL (ref 80.0–100.0)
Platelets: 137 10*3/uL — ABNORMAL LOW (ref 150–400)
RBC: 3.98 MIL/uL (ref 3.87–5.11)
RDW: 14 % (ref 11.5–15.5)
WBC: 13.2 10*3/uL — ABNORMAL HIGH (ref 4.0–10.5)
nRBC: 0 % (ref 0.0–0.2)

## 2024-02-18 LAB — WET PREP, GENITAL
Clue Cells Wet Prep HPF POC: NONE SEEN
Sperm: NONE SEEN
Trich, Wet Prep: NONE SEEN
WBC, Wet Prep HPF POC: >=10 — AB (ref <10)
Yeast Wet Prep HPF POC: NONE SEEN

## 2024-02-18 LAB — TYPE AND SCREEN
ABO/RH(D): A POS
Antibody Screen: NEGATIVE

## 2024-02-18 LAB — FETAL FIBRONECTIN: Fetal Fibronectin: POSITIVE — AB

## 2024-02-18 LAB — RUPTURE OF MEMBRANE (ROM)PLUS: Rom Plus: NEGATIVE

## 2024-02-18 MED ORDER — FENTANYL CITRATE (PF) 100 MCG/2ML IJ SOLN
50.0000 ug | Freq: Once | INTRAMUSCULAR | Status: AC
  Administered 2024-02-18: 50 ug via INTRAVENOUS
  Filled 2024-02-18: qty 2

## 2024-02-18 MED ORDER — BETAMETHASONE SOD PHOS & ACET 6 (3-3) MG/ML IJ SUSP
12.0000 mg | INTRAMUSCULAR | Status: AC
  Administered 2024-02-18 – 2024-02-19 (×2): 12 mg via INTRAMUSCULAR
  Filled 2024-02-18: qty 5

## 2024-02-18 MED ORDER — MAGNESIUM SULFATE BOLUS VIA INFUSION
6.0000 g | Freq: Once | INTRAVENOUS | Status: AC
  Administered 2024-02-18: 6 g via INTRAVENOUS
  Filled 2024-02-18: qty 1000

## 2024-02-18 MED ORDER — LACTATED RINGERS IV SOLN: INTRAVENOUS | Status: AC

## 2024-02-18 MED ORDER — LACTATED RINGERS IV SOLN
125.0000 mL/h | INTRAVENOUS | Status: AC
  Administered 2024-02-19: 125 mL/h via INTRAVENOUS

## 2024-02-18 MED ORDER — MAGNESIUM SULFATE 40 GM/1000ML IV SOLN
2.0000 g/h | INTRAVENOUS | Status: DC
  Administered 2024-02-18 – 2024-02-20 (×3): 2 g/h via INTRAVENOUS
  Filled 2024-02-18 (×3): qty 1000

## 2024-02-18 NOTE — MAU Note (Signed)
.  Monica Mayer is a 28 y.o. at [redacted]w[redacted]d here in MAU reporting:   Ctxs that are 3-5 min apart, pt reports some lof but unsure if it is urine. No bleeding. +FM.   Vitals:   02/18/24 2300 02/18/24 2305  BP:  116/71  Pulse:  82  Resp:  20  Temp:  (!) 97.5 F (36.4 C)  SpO2: 100% 100%    Pain score: 10/10 ctxs lower abdominal  FHT: 163 Lab orders placed from triage:   Wynelle Bourgeois, CNM at bedside.

## 2024-02-18 NOTE — MAU Note (Signed)
 Pt informed that the ultrasound is considered a limited OB ultrasound and is not intended to be a complete ultrasound exam.  Patient also informed that the ultrasound is not being completed with the intent of assessing for fetal or placental anomalies or any pelvic abnormalities.  Explained that the purpose of today's ultrasound is to assess for  presentation.  Patient acknowledges the purpose of the exam and the limitations of the study.      By Wynelle Bourgeois, CNM   Vertex slight left (slightly Oblique), anterior placenta.

## 2024-02-18 NOTE — MAU Provider Note (Signed)
 Chief Complaint:  Contractions   Event Date/Time   First Provider Initiated Contact with Patient 02/18/24 2240    HPI: Monica Mayer is a 28 y.o. G1P0 at 90w6dwho presents to maternity admissions reporting painful contractions tonight.  May have some fluid leaking.  No problems with pregnancy so far. No history of PTL . She reports good fetal movement, denies vaginal bleeding, vaginal itching/burning, urinary symptoms, n/v, diarrhea, constipation or fever/chills.   Abdominal Pain This is a new problem. The current episode started today. The problem occurs intermittently. The quality of the pain is cramping. The abdominal pain does not radiate. Pertinent negatives include no constipation, diarrhea, dysuria, fever or frequency. Nothing aggravates the pain. She has tried nothing for the symptoms.   RN Note: Monica Mayer is a 28 y.o. at [redacted]w[redacted]d here in MAU reporting:   Ctxs that are 3-5 min apart, pt reports some lof but unsure if it is urine. No bleeding. +FM.   Past Medical History: Past Medical History:  Diagnosis Date   Medical history non-contributory     Past obstetric history: OB History  Gravida Para Term Preterm AB Living  1       SAB IAB Ectopic Multiple Live Births          # Outcome Date GA Lbr Len/2nd Weight Sex Type Anes PTL Lv  1 Current             Past Surgical History: Past Surgical History:  Procedure Laterality Date   FRACTURE SURGERY     Right arm fracture repair    Family History: History reviewed. No pertinent family history.  Social History: Social History   Tobacco Use   Smoking status: Never   Smokeless tobacco: Never  Vaping Use   Vaping status: Former  Substance Use Topics   Alcohol use: Not Currently    Comment: occ   Drug use: Not Currently    Types: Marijuana    Comment: pt states she found out she was pregnant and stopped smoking marijuana at 7 weeks.    Allergies:  Allergies  Allergen Reactions   No Known Allergies      Meds:  Medications Prior to Admission  Medication Sig Dispense Refill Last Dose/Taking   Prenatal Vit-Fe Phos-FA-Omega (VITAFOL GUMMIES) 3.33-0.333-34.8 MG CHEW Chew 1 tablet by mouth daily. 90 tablet 5 02/17/2024   amoxicillin-clavulanate (AUGMENTIN) 875-125 MG tablet Take 1 tablet by mouth every 12 (twelve) hours. 14 tablet 0     I have reviewed patient's Past Medical Hx, Surgical Hx, Family Hx, Social Hx, medications and allergies.   ROS:  Review of Systems  Constitutional:  Negative for fever.  Gastrointestinal:  Positive for abdominal pain. Negative for constipation and diarrhea.  Genitourinary:  Negative for dysuria and frequency.   Other systems negative  Physical Exam  Patient Vitals for the past 24 hrs:  BP Temp Temp src Pulse Resp SpO2 Height Weight  02/18/24 2305 116/71 (!) 97.5 F (36.4 C) Oral 82 20 100 % 5\' 2"  (1.575 m) 73.5 kg  02/18/24 2300 -- -- -- -- -- 100 % -- --  02/18/24 2255 -- -- -- -- -- 100 % -- --  02/18/24 2250 -- -- -- -- -- 100 % -- --  02/18/24 2245 -- -- -- -- -- 100 % -- --  02/18/24 2240 -- -- -- -- -- 100 % -- --   Constitutional: Well-developed, well-nourished female in no acute distress but in significant pain with contractions  Cardiovascular: normal rate  Respiratory: normal effort GI: Abd soft, non-tender, gravid appropriate for gestational age.   No rebound or guarding. MS: Extremities nontender, no edema, normal ROM Neurologic: Alert and oriented x 4.  GU: Neg CVAT.  PELVIC EXAM:  Dilation: 3.5 Effacement (%): 60 Exam by:: Wynelle Bourgeois, CNM  FHT:  Baseline 145 , moderate variability, accelerations present, no decelerations Contractions: q 2 mins    Labs: Results for orders placed or performed during the hospital encounter of 02/18/24 (from the past 24 hours)  Fetal fibronectin     Status: Abnormal   Collection Time: 02/18/24 10:51 PM  Result Value Ref Range   Fetal Fibronectin POSITIVE (A) NEGATIVE  Wet prep, genital      Status: Abnormal   Collection Time: 02/18/24 10:51 PM  Result Value Ref Range   Yeast Wet Prep HPF POC NONE SEEN NONE SEEN   Trich, Wet Prep NONE SEEN NONE SEEN   Clue Cells Wet Prep HPF POC NONE SEEN NONE SEEN   WBC, Wet Prep HPF POC >=10 (A) <10   Sperm NONE SEEN   HIV Antibody (routine testing w rflx)     Status: None   Collection Time: 02/18/24 10:51 PM  Result Value Ref Range   HIV Screen 4th Generation wRfx Non Reactive Non Reactive  CBC     Status: Abnormal   Collection Time: 02/18/24 10:51 PM  Result Value Ref Range   WBC 13.2 (H) 4.0 - 10.5 K/uL   RBC 3.98 3.87 - 5.11 MIL/uL   Hemoglobin 11.4 (L) 12.0 - 15.0 g/dL   HCT 16.1 (L) 09.6 - 04.5 %   MCV 88.2 80.0 - 100.0 fL   MCH 28.6 26.0 - 34.0 pg   MCHC 32.5 30.0 - 36.0 g/dL   RDW 40.9 81.1 - 91.4 %   Platelets 137 (L) 150 - 400 K/uL   nRBC 0.0 0.0 - 0.2 %  Comprehensive metabolic panel     Status: Abnormal   Collection Time: 02/18/24 10:51 PM  Result Value Ref Range   Sodium 134 (L) 135 - 145 mmol/L   Potassium 4.0 3.5 - 5.1 mmol/L   Chloride 101 98 - 111 mmol/L   CO2 20 (L) 22 - 32 mmol/L   Glucose, Bld 108 (H) 70 - 99 mg/dL   BUN <5 (L) 6 - 20 mg/dL   Creatinine, Ser 7.82 0.44 - 1.00 mg/dL   Calcium 9.4 8.9 - 95.6 mg/dL   Total Protein 7.2 6.5 - 8.1 g/dL   Albumin 3.1 (L) 3.5 - 5.0 g/dL   AST 21 15 - 41 U/L   ALT 19 0 - 44 U/L   Alkaline Phosphatase 78 38 - 126 U/L   Total Bilirubin 0.4 0.0 - 1.2 mg/dL   GFR, Estimated >21 >30 mL/min   Anion gap 13 5 - 15  Rupture of Membrane (ROM) Plus     Status: None   Collection Time: 02/18/24 10:51 PM  Result Value Ref Range   Rom Plus NEGATIVE   Type and screen  [facility]     Status: None   Collection Time: 02/18/24 10:54 PM  Result Value Ref Range   ABO/RH(D) A POS    Antibody Screen NEG    Sample Expiration      02/21/2024,2359 Performed at [facility], [address], [place], [place] [address]   Urinalysis, Routine w reflex  microscopic -Urine, Clean Catch     Status: Abnormal   Collection Time: 02/19/24  1:23 AM  Result Value Ref Range   Color, Urine YELLOW YELLOW   APPearance HAZY (A) CLEAR   Specific Gravity, Urine 1.010 1.005 - 1.030   pH 7.0 5.0 - 8.0   Glucose, UA NEGATIVE NEGATIVE mg/dL   Hgb urine dipstick NEGATIVE NEGATIVE   Bilirubin Urine NEGATIVE NEGATIVE   Ketones, ur 80 (A) NEGATIVE mg/dL   Protein, ur NEGATIVE NEGATIVE mg/dL   Nitrite NEGATIVE NEGATIVE   Leukocytes,Ua SMALL (A) NEGATIVE   RBC / HPF 0-5 0 - 5 RBC/hpf   WBC, UA 0-5 0 - 5 WBC/hpf   Bacteria, UA RARE (A) NONE SEEN   Squamous Epithelial / HPF 0-5 0 - 5 /HPF   Mucus PRESENT   Rapid urine drug screen (hospital performed)     Status: Abnormal   Collection Time: 02/19/24  1:24 AM  Result Value Ref Range   Opiates NONE DETECTED NONE DETECTED   Cocaine NONE DETECTED NONE DETECTED   Benzodiazepines NONE DETECTED NONE DETECTED   Amphetamines NONE DETECTED NONE DETECTED   Tetrahydrocannabinol POSITIVE (A) NONE DETECTED   Barbiturates NONE DETECTED NONE DETECTED  Group B strep by PCR     Status: None   Collection Time: 02/19/24  1:44 AM   Specimen: Vaginal/Rectal; Genital  Result Value Ref Range   Group B strep by PCR PRESUMPTIVE NEGATIVE PRESUMPTIVE NEGATIVE    --/--/PENDING (04/09 2254)  Imaging:  Pt informed that the ultrasound is considered a limited OB ultrasound and is not intended to be a complete ultrasound exam.  Patient also informed that the ultrasound is not being completed with the intent of assessing for fetal or placental anomalies or any pelvic abnormalities.  Explained that the purpose of today's ultrasound is to assess for presentation.  Patient acknowledges the purpose of the exam and the limitations of the study.    Fetus is in the longitudinal lie Vertex presentation with a little oblique to maternal left Anterior placenta  MAU Course/MDM: I have reviewed the triage vital signs and the nursing notes.    Pertinent labs & imaging results that were available during my care of the patient were reviewed by me and considered in my medical decision making (see chart for details).      I have reviewed her medical records including past results, notes and treatments.   I have ordered labs and reviewed results.  NST reviewed Consult Dr Normand Sloop with presentation, exam findings and test results.  Treatments in MAU included IV fluids, Magnesium Sulfate for neuroprophylaxis and tocolysis, Betamethasone. .    Assessment: 1. Preterm labor in third trimester without delivery   2. [redacted] weeks gestation of pregnancy     Plan: Admit to Labor and Delivery Routine orders Magnesium Sulfate for neuroprophylaxis Betamethasone series Dr Normand Sloop to follow  Wynelle Bourgeois CNM, MSN Certified Nurse-Midwife 02/18/2024 11:26 PM

## 2024-02-19 ENCOUNTER — Other Ambulatory Visit: Payer: Self-pay

## 2024-02-19 ENCOUNTER — Inpatient Hospital Stay (HOSPITAL_COMMUNITY)

## 2024-02-19 DIAGNOSIS — Z3A26 26 weeks gestation of pregnancy: Secondary | ICD-10-CM | POA: Diagnosis not present

## 2024-02-19 DIAGNOSIS — Z3A25 25 weeks gestation of pregnancy: Secondary | ICD-10-CM | POA: Diagnosis not present

## 2024-02-19 LAB — HIV ANTIBODY (ROUTINE TESTING W REFLEX): HIV Screen 4th Generation wRfx: NONREACTIVE

## 2024-02-19 LAB — COMPREHENSIVE METABOLIC PANEL WITH GFR
ALT: 19 U/L (ref 0–44)
AST: 21 U/L (ref 15–41)
Albumin: 3.1 g/dL — ABNORMAL LOW (ref 3.5–5.0)
Alkaline Phosphatase: 78 U/L (ref 38–126)
Anion gap: 13 (ref 5–15)
BUN: <5 mg/dL — ABNORMAL LOW (ref 6–20)
CO2: 20 mmol/L — ABNORMAL LOW (ref 22–32)
Calcium: 9.4 mg/dL (ref 8.9–10.3)
Chloride: 101 mmol/L (ref 98–111)
Creatinine, Ser: 0.49 mg/dL (ref 0.44–1.00)
GFR, Estimated: >60 mL/min (ref >60)
Glucose, Bld: 108 mg/dL — ABNORMAL HIGH (ref 70–99)
Potassium: 4 mmol/L (ref 3.5–5.1)
Sodium: 134 mmol/L — ABNORMAL LOW (ref 135–145)
Total Bilirubin: 0.4 mg/dL (ref 0.0–1.2)
Total Protein: 7.2 g/dL (ref 6.5–8.1)

## 2024-02-19 LAB — MAGNESIUM: Magnesium: 4.8 mg/dL — ABNORMAL HIGH (ref 1.7–2.4)

## 2024-02-19 LAB — RAPID URINE DRUG SCREEN, HOSP PERFORMED
Amphetamines: NOT DETECTED
Barbiturates: NOT DETECTED
Benzodiazepines: NOT DETECTED
Cocaine: NOT DETECTED
Opiates: NOT DETECTED
Tetrahydrocannabinol: POSITIVE — AB

## 2024-02-19 LAB — URINALYSIS, ROUTINE W REFLEX MICROSCOPIC
Bilirubin Urine: NEGATIVE
Glucose, UA: NEGATIVE mg/dL
Hgb urine dipstick: NEGATIVE
Ketones, ur: 80 mg/dL — AB
Nitrite: NEGATIVE
Protein, ur: NEGATIVE mg/dL
Specific Gravity, Urine: 1.01 (ref 1.005–1.030)
pH: 7 (ref 5.0–8.0)

## 2024-02-19 LAB — GROUP B STREP BY PCR: Group B strep by PCR: NEGATIVE

## 2024-02-19 MED ORDER — PROMETHAZINE HCL 25 MG PO TABS: 25.0000 mg | ORAL_TABLET | Freq: Four times a day (QID) | ORAL | Status: DC | PRN

## 2024-02-19 MED ORDER — PENICILLIN G POT IN DEXTROSE 60000 UNIT/ML IV SOLN
3.0000 10*6.[IU] | INTRAVENOUS | Status: DC
  Administered 2024-02-19 – 2024-02-20 (×10): 3 10*6.[IU] via INTRAVENOUS
  Filled 2024-02-19 (×12): qty 50

## 2024-02-19 MED ORDER — SODIUM CHLORIDE 0.9 % IV SOLN: 25.0000 mg | Freq: Four times a day (QID) | INTRAVENOUS | Status: DC | PRN

## 2024-02-19 MED ORDER — PROMETHAZINE HCL 25 MG RE SUPP: 25.0000 mg | Freq: Four times a day (QID) | RECTAL | Status: DC | PRN

## 2024-02-19 MED ORDER — FENTANYL CITRATE (PF) 100 MCG/2ML IJ SOLN
50.0000 ug | INTRAMUSCULAR | Status: DC | PRN
  Administered 2024-02-19 – 2024-02-20 (×5): 100 ug via INTRAVENOUS
  Filled 2024-02-19 (×4): qty 2

## 2024-02-19 MED ORDER — ONDANSETRON HCL 4 MG/2ML IJ SOLN
4.0000 mg | Freq: Four times a day (QID) | INTRAMUSCULAR | Status: DC | PRN
  Administered 2024-02-19: 4 mg via INTRAVENOUS
  Filled 2024-02-19: qty 2

## 2024-02-19 MED ORDER — FENTANYL CITRATE (PF) 100 MCG/2ML IJ SOLN
INTRAMUSCULAR | Status: AC
  Filled 2024-02-19: qty 2

## 2024-02-19 MED ORDER — LACTATED RINGERS IV BOLUS
500.0000 mL | Freq: Once | INTRAVENOUS | Status: AC
  Administered 2024-02-19: 500 mL via INTRAVENOUS

## 2024-02-19 MED ORDER — SODIUM CHLORIDE 0.9 % IV SOLN
5.0000 10*6.[IU] | Freq: Once | INTRAVENOUS | Status: AC
  Administered 2024-02-19: 5 10*6.[IU] via INTRAVENOUS
  Filled 2024-02-19: qty 5

## 2024-02-19 MED ORDER — SODIUM CHLORIDE 0.9 % IV SOLN: 5.0000 10*6.[IU] | Freq: Four times a day (QID) | INTRAVENOUS | Status: DC

## 2024-02-19 NOTE — H&P (Signed)
 Monica Mayer is 1996-07-12  No LMP recorded. Patient is pregnant. [redacted]w[redacted]d  Pt presents with Preterm labor [O60.00] contractions increasing in intensity and frequency. Questionable LOF  Pregnancy complicated by PTL PMHX  Past Medical History:  Diagnosis Date   Medical history non-contributory     PSURG HX  Past Surgical History:  Procedure Laterality Date   FRACTURE SURGERY     Right arm fracture repair    Fam HX History reviewed. No pertinent family history.  Soc HX  Social History   Socioeconomic History   Marital status: Single    Spouse name: Not on file   Number of children: Not on file   Years of education: Not on file   Highest education level: Not on file  Occupational History   Not on file  Tobacco Use   Smoking status: Never   Smokeless tobacco: Never  Vaping Use   Vaping status: Former  Substance and Sexual Activity   Alcohol use: Not Currently    Comment: occ   Drug use: Not Currently    Types: Marijuana    Comment: pt states she found out she was pregnant and stopped smoking marijuana at 7 weeks.   Sexual activity: Yes    Birth control/protection: None  Other Topics Concern   Not on file  Social History Narrative   Not on file   Social Drivers of Health   Financial Resource Strain: Not on file  Food Insecurity: Not on file  Transportation Needs: Not on file  Physical Activity: Not on file  Stress: Not on file  Social Connections: Unknown (03/26/2022)   Received from [facility], [facility]   Social Network    Social Network: Not on file  Intimate Partner Violence: Unknown (02/14/2022)   Received from [employer], [facility]   HITS    Physically Hurt: Not on file    Insult or Talk Down To: Not on file    Threaten Physical Harm: Not on file    Scream or Curse: Not on file    ROS  Review of Systems - Negative except above  [place] SIUP  Gen WDWN  IN NAD CV RRR LUNGS CTAB ABD Gravid soft NT GU 3/50/ bulging membranes EXT no  calf tenderness A/P: IUP [redacted]w[redacted]d SP Admit to antepartum PTL Magnesium tocolysis Check UDS, cbc, cervical cultures and GBS PCN G until GBS known BMZ  MFM and NICu consult in am Cat 1 toco q 2-5 min  Monitor closely

## 2024-02-19 NOTE — Plan of Care (Signed)

## 2024-02-19 NOTE — Progress Notes (Signed)
 RN at bedside from 0312 adjusting U/S. Fetal movement verified by palpation and maternal confirmation.

## 2024-02-19 NOTE — Progress Notes (Addendum)
 Monica Mayer is a 28 y.o. G1P0 at [redacted]w[redacted]d   Subjective: Laying comfortably in bed.  Pt says contractions have spaced to 2-3/hr but slighlty more intense when they do come compared to when they were coming more frequently.  No abnormal discharge and no LOF.    Objective: BP 122/74   Pulse 86   Temp 97.9 F (36.6 C) (Oral)   Resp 18   Ht 5\' 2"  (1.575 m)   Wt 73.5 kg   SpO2 99%   BMI 29.63 kg/m  I/O last 3 completed shifts: In: 120 [P.O.:120] Out: 1625 [Urine:1625] Total I/O In: 1455 [P.O.:80; I.V.:775; IV Piggyback:600] Out: 1850 [Urine:1850]  FHT:  FHR: 120 bpm, variability: moderate,  accelerations:   occasional,  decelerations:  Absent UC:   not picking up, just some irritability SVE:   Dilation: 4 Effacement (%): 70 Exam by:: Juanetta Snow, RN  Labs: Lab Results  Component Value Date   WBC 13.2 (H) 02/18/2024   HGB 11.4 (L) 02/18/2024   HCT 35.1 (L) 02/18/2024   MCV 88.2 02/18/2024   PLT 137 (L) 02/18/2024   GC/CT pending Urine Cx pending  Assessment / Plan: Admitted in PTL now stable on Magnesium Sulfate  Labor:  stable on mg, will keep on L&D until pt has received 2nd dose of BMZ and remains stable Preeclampsia:  no signs or symptoms of toxicity Fetal Wellbeing:  Category I Pain Control:  IV pain meds I/D:   GBS presumptively negative, culture pending Anticipated MOD:   trying to avoid delivery S/p NICU Consult this morning by Dr. Shelva Majestic, MD 02/19/2024, 3:46 PM

## 2024-02-19 NOTE — Consult Note (Signed)
 Consultation Service: Neonatology   Dr. Normand Sloop has asked for consultation on [facility] regarding the care of a premature infant at 23. Thank you for inviting [place] to see this patient.   Reason for consult:  Explain the possible complications, the prognosis, and the care of a premature infant at 75 and 0/7 weeks.  Chief complaint: 28 y.o. female with a singleton female IUP named "Gabon" with an unknown estimated weight (plan on [place] this AM). Pregnancy has been complicated by  preterm labor .  Plan is for delivery via vaginal delivery if possible (currently vertex).  My key findings of this patient's HPI are:  I have reviewed the patient's chart and have met with her. The salient information is as follows:   Mom is admitted to L&D for preterm labor and is dilated to 4 cm with 70% effacement. Monitoring for fetal/maternal distress with no concerns at this time. On latency antibiotics and continuous monitoring.    Prenatal labs:   Prenatal care:   limited Pregnancy complications:  preterm labor Maternal antibiotics: This patient's mother is not on file. Maternal Steroids: BMZ Most recent dose:  2315 on 4/9   My recommendations for this patient and my actions included:   1. In the presence of the [facility] and her fiance, I spent 15 minutes discussing the possible complications and outcomes of prematurity at this gestational age. I discussed specific complications at this gestational age referencing the need for resuscitation at birth due to respiratory distress which may require mechanical ventilation, CPAP, and surfactant administration. In addition infant may require IV fluids pending establishment of enteral feeds (encouraged breast milk feeding), antibiotics for possible sepsis, temperature support, and continuous monitoring. I also discussed the potential risk of complications such as intracranial hemorrhage, retinopathy, hearing deficit, and chronic lung disease. I discussed  this with parents in detail and they expressed an understanding of the risks and complications of prematurity.   2. I also discussed the expected survival of an infant born at 99, which is (Moderate to Good). We further discussed that (Few) of the neonates born at this age have profound or severe neurological complications and school difficulties. In addition, (Some) of the neonates born at this age will have some for of mild to moderate neurological complications. She expressed an understanding of this information.   3. I informed her that the NICU team would be present at the delivery. She agreed that all appropriate medical measures could be taken to resuscitate her infant at the delivery. She also understood that our team will always be available for any questions that come up during their infant's hospitalization and we will continue to partner with their family to support them through this difficult time. Visitation policy was discussed and all questions were addressed.   Final Impression:  28 y.o. female with a singleton female IUP named "Molinda Bailiff" who is threatening to deliver and who now understands the possible complications and prognosis of her infant. The mother agrees with plan for resuscitation and ICU care. Enedina Crowson's questions were answered. She is planning to try and provide breast milk for her infant.    ______________________________________________________________________  Thank you for asking [place] to participate in the care of this patient. Please do not hesitate to contact us again if you are aware of any further ways we can be of assistance.   Sincerely,  Harlow Mares, MD Attending Neonatologist   I spent ~35 minutes in consultation time, of which 15 minutes was spent  in direct face to face counseling.

## 2024-02-19 NOTE — Progress Notes (Signed)
 Ultrasound at bedside

## 2024-02-20 ENCOUNTER — Inpatient Hospital Stay (HOSPITAL_COMMUNITY): Admitting: Anesthesiology

## 2024-02-20 ENCOUNTER — Encounter (HOSPITAL_COMMUNITY): Payer: Self-pay

## 2024-02-20 LAB — GC/CHLAMYDIA PROBE AMP (~~LOC~~) NOT AT ARMC
Chlamydia: NEGATIVE
Comment: NEGATIVE
Comment: NORMAL
Neisseria Gonorrhea: NEGATIVE

## 2024-02-20 LAB — MAGNESIUM: Magnesium: 6.3 mg/dL (ref 1.7–2.4)

## 2024-02-20 MED ORDER — OXYTOCIN-SODIUM CHLORIDE 30-0.9 UT/500ML-% IV SOLN
2.5000 [IU]/h | INTRAVENOUS | Status: DC
  Filled 2024-02-20: qty 500

## 2024-02-20 MED ORDER — ONDANSETRON HCL 4 MG/2ML IJ SOLN
4.0000 mg | Freq: Four times a day (QID) | INTRAMUSCULAR | Status: DC | PRN
  Administered 2024-02-20: 4 mg via INTRAVENOUS
  Filled 2024-02-20: qty 2

## 2024-02-20 MED ORDER — EPHEDRINE 5 MG/ML INJ: 10.0000 mg | INTRAVENOUS | Status: DC | PRN

## 2024-02-20 MED ORDER — ACETAMINOPHEN 325 MG PO TABS
650.0000 mg | ORAL_TABLET | ORAL | Status: DC | PRN
  Administered 2024-02-21: 650 mg via ORAL
  Filled 2024-02-20: qty 2

## 2024-02-20 MED ORDER — LIDOCAINE HCL (PF) 1 % IJ SOLN: 30.0000 mL | INTRAMUSCULAR | Status: DC | PRN

## 2024-02-20 MED ORDER — LACTATED RINGERS IV SOLN: INTRAVENOUS | Status: DC

## 2024-02-20 MED ORDER — PHENYLEPHRINE 80 MCG/ML (10ML) SYRINGE FOR IV PUSH (FOR BLOOD PRESSURE SUPPORT): 80.0000 ug | PREFILLED_SYRINGE | INTRAVENOUS | Status: DC | PRN

## 2024-02-20 MED ORDER — SOD CITRATE-CITRIC ACID 500-334 MG/5ML PO SOLN: 30.0000 mL | ORAL | Status: DC | PRN

## 2024-02-20 MED ORDER — DIPHENHYDRAMINE HCL 50 MG/ML IJ SOLN: 12.5000 mg | INTRAMUSCULAR | Status: DC | PRN

## 2024-02-20 MED ORDER — FENTANYL-BUPIVACAINE-NACL 0.5-0.125-0.9 MG/250ML-% EP SOLN
12.0000 mL/h | EPIDURAL | Status: DC | PRN
  Administered 2024-02-20: 12 mL/h via EPIDURAL
  Filled 2024-02-20 (×2): qty 250

## 2024-02-20 MED ORDER — LACTATED RINGERS IV SOLN
500.0000 mL | Freq: Once | INTRAVENOUS | Status: AC
  Administered 2024-02-20: 500 mL via INTRAVENOUS

## 2024-02-20 MED ORDER — OXYTOCIN BOLUS FROM INFUSION
333.0000 mL | Freq: Once | INTRAVENOUS | Status: AC
  Administered 2024-02-21: 333 mL via INTRAVENOUS

## 2024-02-20 MED ORDER — LIDOCAINE HCL (PF) 1 % IJ SOLN
INTRAMUSCULAR | Status: DC | PRN
  Administered 2024-02-20 (×2): 4 mL via EPIDURAL

## 2024-02-20 MED ORDER — LACTATED RINGERS IV BOLUS
500.0000 mL | Freq: Once | INTRAVENOUS | Status: AC
  Administered 2024-02-20: 500 mL via INTRAVENOUS

## 2024-02-20 MED ORDER — LACTATED RINGERS IV SOLN: 500.0000 mL | INTRAVENOUS | Status: DC | PRN

## 2024-02-20 NOTE — Progress Notes (Addendum)
 Monica Mayer is a 28 y.o. G1P0 at [redacted]w[redacted]d   Subjective: Feeling as if she needs to have a bowel movement.   Objective: BP 135/69   Pulse 82   Temp 98.4 F (36.9 C) (Oral)   Resp 16   Ht 5\' 2"  (1.575 m)   Wt 73.5 kg   SpO2 99%   BMI 29.63 kg/m  I/O last 3 completed shifts: In: 2968.5 [P.O.:375; I.V.:1640.9; Other:52.6; IV Piggyback:900] Out: 6275 [Urine:6275] Total I/O In: 1662.5 [P.O.:150; I.V.:1180.5; Other:132; IV Piggyback:200] Out: 2300 [Urine:2300]  FHT:  FHR: 120 bpm, variability: moderate,  accelerations:   occasional,  decelerations:  Absent UC: every 3-6 mins SVE:   Dilation: 8 Effacement (%): 90 Station: 0 Exam by:: Monica Mayer  Labs: Lab Results  Component Value Date   WBC 13.2 (H) 02/18/2024   HGB 11.4 (L) 02/18/2024   HCT 35.1 (L) 02/18/2024   MCV 88.2 02/18/2024   PLT 137 (L) 02/18/2024   GC/CT pending Urine Cx pending  Assessment / Plan: Admitted in PTL now stable on Magnesium Sulfate  Labor: s/p BMZ x2 and magnesium. Labor continues to progress without intervention. The patient does not desire intervention. As previously discussed with MFM Dr. Judeth Cornfield today to discontinue continue magnesium is appropriate and low intervention is very reasonable given lack of fetal and maternal distress.  Preeclampsia:  no signs or symptoms of toxicity Fetal Wellbeing:  Category I Pain Control:  Epidural I/D:   GBS presumptively negative, culture pending; PCN ppx due to preterm status Anticipated MOD:   trying to avoid delivery S/p NICU Consult by Dr. Judyann Munson Autry-Lott, DO 02/20/2024, 6:55 PM

## 2024-02-20 NOTE — Progress Notes (Signed)
 Pt offered epidural for pain and declined

## 2024-02-20 NOTE — Progress Notes (Signed)
 Called to the patient's room by RN. Unable to keep FHR on monitor. Check the patient she is now 9/90/0. Appropriate FHR found left lower maternal abdomen and the monitor was secured there. I am on standby for delivery.   Lavonda Jumbo, DO [facility] 02/20/2024, 8:02 PM

## 2024-02-20 NOTE — Progress Notes (Signed)
 Received update from RN. The patient is complete and zero station.   Remote review:   EFM: 125 bpm/moderate variability/+accels, no decels Toco: CTX every 3-4 mins Cat I  Continue to manage expectantly.   Lavonda Jumbo, DO [facility] 02/20/2024, 11:42 PM

## 2024-02-20 NOTE — Progress Notes (Signed)
RN remains at bedside  

## 2024-02-20 NOTE — Anesthesia Procedure Notes (Signed)
 Epidural Patient location during procedure: OB Start time: 02/20/2024 2:29 AM End time: 02/20/2024 2:32 AM  Staffing Anesthesiologist: Kaylyn Layer, MD Performed: anesthesiologist   Preanesthetic Checklist Completed: patient identified, IV checked, risks and benefits discussed, monitors and equipment checked, pre-op evaluation and timeout performed  Epidural Patient position: sitting Prep: DuraPrep and site prepped and draped Patient monitoring: continuous pulse ox, blood pressure and heart rate Approach: midline Location: L3-L4 Injection technique: LOR air  Needle:  Needle type: Tuohy  Needle gauge: 17 G Needle length: 9 cm Needle insertion depth: 6 cm Catheter type: closed end flexible Catheter size: 19 Gauge Catheter at skin depth: 11 cm Test dose: negative and Other (1% lidocaine)  Assessment Events: blood not aspirated, no cerebrospinal fluid, injection not painful, no injection resistance, no paresthesia and negative IV test  Additional Notes Patient identified. Risks, benefits, and alternatives discussed with patient including but not limited to bleeding, infection, nerve damage, paralysis, failed block, incomplete pain control, headache, blood pressure changes, nausea, vomiting, reactions to medication, itching, and postpartum back pain. Confirmed with bedside nurse the patient's most recent platelet count. Confirmed with patient that they are not currently taking any anticoagulation, have any bleeding history, or any family history of bleeding disorders. Patient expressed understanding and wished to proceed. All questions were answered. Sterile technique was used throughout the entire procedure. Please see nursing notes for vital signs.   Crisp LOR on first pass. Test dose was given through epidural catheter and negative prior to continuing to dose epidural or start infusion. Warning signs of high block given to the patient including shortness of breath,  tingling/numbness in hands, complete motor block, or any concerning symptoms with instructions to call for help. Patient was given instructions on fall risk and not to get out of bed. All questions and concerns addressed with instructions to call with any issues or inadequate analgesia.  Reason for block:procedure for pain

## 2024-02-20 NOTE — Progress Notes (Signed)
 RN at bedside

## 2024-02-20 NOTE — Progress Notes (Signed)
 Monica Mayer is a 28 y.o. G1P0 at [redacted]w[redacted]d   Subjective: States she feels sleepy. Otherwise comfortable with her epidural.   Objective: BP 115/73   Pulse 87   Temp 98.1 F (36.7 C) (Oral)   Resp 16   Ht 5\' 2"  (1.575 m)   Wt 73.5 kg   SpO2 99%   BMI 29.63 kg/m  I/O last 3 completed shifts: In: 2968.5 [P.O.:375; I.V.:1640.9; Other:52.6; IV Piggyback:900] Out: 6275 [Urine:6275] Total I/O In: 769.1 [P.O.:100; I.V.:509.1; Other:60; IV Piggyback:100] Out: 800 [Urine:800]  FHT:  FHR: 120 bpm, variability: moderate,  accelerations:   occasional,  decelerations:  Absent UC: every 3-6 mins SVE:   Dilation: 7 Effacement (%): 90 Station: -1 Exam by:: J.Cox, RN  Labs: Lab Results  Component Value Date   WBC 13.2 (H) 02/18/2024   HGB 11.4 (L) 02/18/2024   HCT 35.1 (L) 02/18/2024   MCV 88.2 02/18/2024   PLT 137 (L) 02/18/2024   GC/CT pending Urine Cx pending  Assessment / Plan: Admitted in PTL now stable on Magnesium Sulfate  Labor:  stable on mg, will keep on L&D due to continued cervical change. S/p BMZ x2 will consider discontinuing mag Preeclampsia:  no signs or symptoms of toxicity Fetal Wellbeing:  Category I Pain Control:  IV pain meds I/D:   GBS presumptively negative, culture pending; PCN ppx due to preterm status Anticipated MOD:   trying to avoid delivery S/p NICU Consult by Dr. Judyann Munson Autry-Lott, DO 02/20/2024, 12:36 PM

## 2024-02-20 NOTE — Anesthesia Preprocedure Evaluation (Signed)
 Anesthesia Evaluation  Patient identified by MRN, date of birth, ID band Patient awake    Reviewed: Allergy & Precautions, Patient's Chart, lab work & pertinent test results  History of Anesthesia Complications Negative for: history of anesthetic complications  Airway Mallampati: II  TM Distance: >3 FB Neck ROM: Full    Dental no notable dental hx.    Pulmonary neg pulmonary ROS   Pulmonary exam normal        Cardiovascular negative cardio ROS Normal cardiovascular exam     Neuro/Psych negative neurological ROS     GI/Hepatic negative GI ROS, Neg liver ROS,,,  Endo/Other  negative endocrine ROS    Renal/GU negative Renal ROS     Musculoskeletal negative musculoskeletal ROS (+)    Abdominal   Peds  Hematology negative hematology ROS (+)   Anesthesia Other Findings Day of surgery medications reviewed with patient.  Reproductive/Obstetrics (+) Pregnancy ([redacted]w[redacted]d, active labor)                             Anesthesia Physical Anesthesia Plan  ASA: 2  Anesthesia Plan: Epidural   Post-op Pain Management:    Induction:   PONV Risk Score and Plan: Treatment may vary due to age or medical condition  Airway Management Planned: Natural Airway  Additional Equipment: Fetal Monitoring  Intra-op Plan:   Post-operative Plan:   Informed Consent: I have reviewed the patients History and Physical, chart, labs and discussed the procedure including the risks, benefits and alternatives for the proposed anesthesia with the patient or authorized representative who has indicated his/her understanding and acceptance.       Plan Discussed with:   Anesthesia Plan Comments:        Anesthesia Quick Evaluation

## 2024-02-21 ENCOUNTER — Encounter (HOSPITAL_COMMUNITY): Payer: Self-pay | Admitting: Obstetrics and Gynecology

## 2024-02-21 LAB — CBC WITH DIFFERENTIAL/PLATELET
Abs Immature Granulocytes: 0.04 10*3/uL (ref 0.00–0.07)
Basophils Absolute: 0 10*3/uL (ref 0.0–0.1)
Basophils Relative: 0 %
Eosinophils Absolute: 0 10*3/uL (ref 0.0–0.5)
Eosinophils Relative: 0 %
HCT: 32.4 % — ABNORMAL LOW (ref 36.0–46.0)
Hemoglobin: 10.5 g/dL — ABNORMAL LOW (ref 12.0–15.0)
Immature Granulocytes: 0 %
Lymphocytes Relative: 19 %
Lymphs Abs: 2 10*3/uL (ref 0.7–4.0)
MCH: 28.5 pg (ref 26.0–34.0)
MCHC: 32.4 g/dL (ref 30.0–36.0)
MCV: 88 fL (ref 80.0–100.0)
Monocytes Absolute: 0.5 10*3/uL (ref 0.1–1.0)
Monocytes Relative: 4 %
Neutro Abs: 8 10*3/uL — ABNORMAL HIGH (ref 1.7–7.7)
Neutrophils Relative %: 77 %
Platelets: 137 10*3/uL — ABNORMAL LOW (ref 150–400)
RBC: 3.68 MIL/uL — ABNORMAL LOW (ref 3.87–5.11)
RDW: 14.4 % (ref 11.5–15.5)
WBC: 10.5 10*3/uL (ref 4.0–10.5)
nRBC: 0 % (ref 0.0–0.2)

## 2024-02-21 LAB — CULTURE, OB URINE

## 2024-02-21 LAB — RPR: RPR Ser Ql: NONREACTIVE

## 2024-02-21 MED ORDER — IBUPROFEN 600 MG PO TABS
600.0000 mg | ORAL_TABLET | Freq: Four times a day (QID) | ORAL | Status: DC
  Administered 2024-02-21 – 2024-02-23 (×7): 600 mg via ORAL
  Filled 2024-02-21 (×8): qty 1

## 2024-02-21 MED ORDER — DIBUCAINE (PERIANAL) 1 % EX OINT: 1.0000 | TOPICAL_OINTMENT | CUTANEOUS | Status: DC | PRN

## 2024-02-21 MED ORDER — ACETAMINOPHEN 325 MG PO TABS
650.0000 mg | ORAL_TABLET | ORAL | Status: DC | PRN
  Administered 2024-02-21 – 2024-02-23 (×3): 650 mg via ORAL
  Filled 2024-02-21 (×3): qty 2

## 2024-02-21 MED ORDER — COCONUT OIL OIL
1.0000 | TOPICAL_OIL | Status: DC | PRN
  Administered 2024-02-22: 1 via TOPICAL

## 2024-02-21 MED ORDER — PRENATAL MULTIVITAMIN CH
1.0000 | ORAL_TABLET | Freq: Every day | ORAL | Status: DC
  Administered 2024-02-21 – 2024-02-23 (×3): 1 via ORAL
  Filled 2024-02-21 (×3): qty 1

## 2024-02-21 MED ORDER — TETANUS-DIPHTH-ACELL PERTUSSIS 5-2.5-18.5 LF-MCG/0.5 IM SUSY
0.5000 mL | PREFILLED_SYRINGE | Freq: Once | INTRAMUSCULAR | Status: AC
  Administered 2024-02-23: 0.5 mL via INTRAMUSCULAR
  Filled 2024-02-21: qty 0.5

## 2024-02-21 MED ORDER — BENZOCAINE-MENTHOL 20-0.5 % EX AERO: 1.0000 | INHALATION_SPRAY | CUTANEOUS | Status: DC | PRN

## 2024-02-21 MED ORDER — SIMETHICONE 80 MG PO CHEW: 80.0000 mg | CHEWABLE_TABLET | ORAL | Status: DC | PRN

## 2024-02-21 MED ORDER — SENNOSIDES-DOCUSATE SODIUM 8.6-50 MG PO TABS
2.0000 | ORAL_TABLET | Freq: Every day | ORAL | Status: DC
  Administered 2024-02-22 – 2024-02-23 (×2): 2 via ORAL
  Filled 2024-02-21 (×2): qty 2

## 2024-02-21 MED ORDER — ONDANSETRON HCL 4 MG PO TABS: 4.0000 mg | ORAL_TABLET | ORAL | Status: DC | PRN

## 2024-02-21 MED ORDER — ONDANSETRON HCL 4 MG/2ML IJ SOLN: 4.0000 mg | INTRAMUSCULAR | Status: DC | PRN

## 2024-02-21 MED ORDER — DIPHENHYDRAMINE HCL 25 MG PO CAPS
25.0000 mg | ORAL_CAPSULE | Freq: Four times a day (QID) | ORAL | Status: DC | PRN
  Administered 2024-02-23: 25 mg via ORAL
  Filled 2024-02-21: qty 1

## 2024-02-21 MED ORDER — WITCH HAZEL-GLYCERIN EX PADS: 1.0000 | MEDICATED_PAD | CUTANEOUS | Status: DC | PRN

## 2024-02-21 MED ORDER — ZOLPIDEM TARTRATE 5 MG PO TABS: 5.0000 mg | ORAL_TABLET | Freq: Every evening | ORAL | Status: DC | PRN

## 2024-02-21 NOTE — Plan of Care (Signed)
  Problem: Education: Goal: Knowledge of disease or condition will improve Outcome: Progressing Goal: Knowledge of the prescribed therapeutic regimen will improve Outcome: Progressing   Problem: Clinical Measurements: Goal: Complications related to the disease process, condition or treatment will be avoided or minimized Outcome: Progressing   Problem: Health Behavior/Discharge Planning: Goal: Ability to manage health-related needs will improve Outcome: Progressing   Problem: Clinical Measurements: Goal: Ability to maintain clinical measurements within normal limits will improve Outcome: Progressing Goal: Will remain free from infection Outcome: Progressing Goal: Diagnostic test results will improve Outcome: Progressing Goal: Respiratory complications will improve Outcome: Progressing Goal: Cardiovascular complication will be avoided Outcome: Progressing   Problem: Elimination: Goal: Will not experience complications related to bowel motility Outcome: Progressing   Problem: Pain Managment: Goal: General experience of comfort will improve and/or be controlled Outcome: Progressing   Problem: Safety: Goal: Ability to remain free from injury will improve Outcome: Progressing   Problem: Skin Integrity: Goal: Risk for impaired skin integrity will decrease Outcome: Progressing   Problem: Education: Goal: Knowledge of condition will improve Outcome: Progressing   Problem: Coping: Goal: Ability to identify and utilize available resources and services will improve Outcome: Progressing   Problem: Life Cycle: Goal: Chance of risk for complications during the postpartum period will decrease Outcome: Progressing   Problem: Skin Integrity: Goal: Demonstration of wound healing without infection will improve Outcome: Progressing

## 2024-02-21 NOTE — Lactation Note (Signed)
 This note was copied from a baby's chart. Lactation Consultation Note  Patient Name: Monica Mayer [id number] Date: 02/21/2024 Age:27 hours - [name] " [name] "  Reason for consult: Initial assessment;NICU baby;Preterm <34wks;Mother's request;RN request;Primapara;1st time breastfeeding;Infant < 5lbs (26 2/7 weeker - BW - 1 - 14 oz ( 850 gm )) The NICU nurse called the [facility], OBSC secretary and LC confirmed with the moms nurse the DEBP had not been set up.  MOB, FOB , and grandfather in the room. LC asked mom if she desired privacy and she responded she was ok with then being in the room. Grandfather turned is chair and was napping and FOB sitting on the couch.  LC set the DEBP up and checked the flange #21 and per mom comfortable. LC felt the sizing could be decreased to #18 F and mom aware the LC would send a set of #18's down via the tube system.  MOM aware try them and see what her comfort is , if more comfortable with the #21 F use them.  LC didn't show mom hand expressing due to company, and [facility] had her pump with her gown covering the pump pieces after centering them.  LC reviewed pumping goals for 24 hours, ( 8-10 times in 24 hours both breast for 15 mins - every 2-3 hours )  LC sent a [facility] referral for DEBP.  Maternal Data Does the patient have breastfeeding experience prior to this delivery?: No  Feeding Mother's Current Feeding Choice: Breast Milk    Lactation Tools Discussed/Used Tools: Pump;Flanges (LC started mom with pumping, Started with the #21 Flanges and per mom comfortable and will send OBSC #18 F's. Mom aware if not comfortable to switch back to the #21 F) Flange Size: 18;21 Breast pump type: Double-Electric Breast Pump;Manual Pump Education: Setup, frequency, and cleaning;Milk Storage Pumping frequency: 8-10 times in 24 hours - Every 2-3 hours both breast with the DEBP  Interventions  Education  [facility] resources and storage , cleaning of pump pieces  Discharge Pump:  Referral sent for [facility] Pump (mom consented for the [facility] referral to be sent)  Consult Status Consult Status: NICU follow-up Date: 02/22/24 Follow-up type: In-patient    [name] Eion Timbrook 02/21/2024, 12:55 PM

## 2024-02-21 NOTE — Anesthesia Postprocedure Evaluation (Signed)
 Anesthesia Post Note  Patient: Monica Mayer  Procedure(s) Performed: AN AD HOC LABOR EPIDURAL     Patient location during evaluation: Women's Unit Anesthesia Type: Epidural Level of consciousness: awake and alert Pain management: pain level controlled Vital Signs Assessment: post-procedure vital signs reviewed and stable Respiratory status: spontaneous breathing, nonlabored ventilation and respiratory function stable Cardiovascular status: stable Postop Assessment: no headache, no backache and epidural receding Anesthetic complications: no Comments: Per Patient's RN   No notable events documented.  Last Vitals:  Vitals:   02/21/24 0741 02/21/24 1341  BP: 122/66 119/72  Pulse: 79 72  Resp: 17 18  Temp:  36.9 C  SpO2:  100%    Last Pain:  Vitals:   02/21/24 1341  TempSrc: Oral  PainSc:    Pain Goal:                   Topaz Raglin

## 2024-02-21 NOTE — Lactation Note (Signed)
 This note was copied from a baby's chart. Lactation Consultation Note  Patient Name: Monica Mayer [id number] Date: 02/21/2024 Age:27 hours Reason for consult:  (#18 flanges sent to [facility] and mom received her Spectra  DEBP)   Maternal Data Does the patient have breastfeeding experience prior to this delivery?: No  Feeding Mother's Current Feeding Choice: Breast Milk   Discharge Pump: Personal;Received [employer] Pump;Referral sent for [facility] Pump  Consult Status Consult Status: NICU follow-up Date: 02/22/24 Follow-up type: In-patient    [name] [name] 02/21/2024, 1:43 PM

## 2024-02-22 DIAGNOSIS — D509 Iron deficiency anemia, unspecified: Secondary | ICD-10-CM | POA: Diagnosis present

## 2024-02-22 LAB — CULTURE, BETA STREP (GROUP B ONLY)

## 2024-02-22 NOTE — Plan of Care (Signed)
  Problem: Clinical Measurements: Goal: Complications related to the disease process, condition or treatment will be avoided or minimized Outcome: Progressing   Problem: Clinical Measurements: Goal: Ability to maintain clinical measurements within normal limits will improve Outcome: Progressing Goal: Will remain free from infection Outcome: Progressing Goal: Diagnostic test results will improve Outcome: Progressing   Problem: Pain Managment: Goal: General experience of comfort will improve and/or be controlled Outcome: Progressing   Problem: Safety: Goal: Ability to remain free from injury will improve Outcome: Progressing   Problem: Skin Integrity: Goal: Risk for impaired skin integrity will decrease Outcome: Progressing

## 2024-02-22 NOTE — Lactation Note (Signed)
 This note was copied from a baby's chart.  NICU Lactation Consultation Note  Patient Name: Boy Tangi Shroff WUJWJ'[id number] Date: 02/22/2024 Age:27 hours  Reason for consult: Follow-up assessment; NICU baby; Preterm <34wks; Infant < 5lbs; Other (Comment); Primapara; 1st time breastfeeding (maternal THC (+) on 02/19/2024)  SUBJECTIVE Visited with family of 27 65/66 weeks old AGA NICU female; baby "[name]" got admitted due to prematurity. Ms. Monroy is a P1 and reported she's been pumping consistently and getting enough colostrum to collect, she was a bit disappointed that baby was given donor milk when her pumping volumes are sufficient for trophic feedings. Explained how the milk bank works and that sometimes if parents bring milk after the milk bank has done their rounds, they won't be picking up again until later in the afternoon. Her plan is to do combo feeding with direct breastfeeding along with pumping and bottle feeding, but wants to keep baby "[name]" on an exclusive breastmilk diet, preferably her own. Assisted with breast massage, hand expression;  provided a pumping band in size S/M for hands on pumping and a Dr. Bevin Bucks steam bag for sanitizing her pump pieces. Noticed that she's pumping one side at a time, explained the importance of bilateral pumping. Reviewed pumping schedule, pumping log, lactogenesis II, benefits of premature milk and anticipatory guidelines.   OBJECTIVE Infant data: Mother's Current Feeding Choice: Breast Milk  O2 Device: Si-PAP FiO2 (%): 23 %  Maternal data: G1P0101 Vaginal, Spontaneous Has patient been taught Hand Expression?: Yes Hand Expression Comments: colostrum leaking and easily expressed Significant Breast History:: (+) breast changes during the pregnancy Current breast feeding challenges:: NICU admission Does the patient have breastfeeding experience prior to this delivery?: No Pumping frequency: initiated pumping at 15 hours post-partum Pumped volume: 20  mL Flange Size: 18; 21 Hands-free pumping top sizes: Small/Medium (Blue) Risk factor for low/delayed milk supply:: primipara, prematurity, infant separation, < 2 lbs, delayed onset of pumping  Pump: Received [facility] Pump (Spectra  S2)  ASSESSMENT Infant: Feeding Status: Trophic feedings Feeding method: Tube/Gavage (Bolus)  Maternal: Milk volume: Normal  INTERVENTIONS/PLAN Interventions: Interventions: Breast feeding basics reviewed; Breast massage; Hand express; Coconut oil; Education; DEBP; NICU Pumping Log; CDC Guidelines for Breast Pump Cleaning; LC Services brochure Tools: Flanges; Coconut oil; Hands-free pumping top Pump Education: Setup, frequency, and cleaning; Milk Storage  Plan: STS once able to Massage and hand express both breasts prior/after pumping; coconut oil prior pumping Pump both breasts on initiate mode every 3 hours for 15 minutes; ideally 8 pumping sessions/24 hours  FOB present and supportive. All questions and concerns answered, family to contact [facility] services PRN.  Consult Status: NICU follow-up NICU Follow-up type: Maternal D/C visit   Kyliee Ortego Newman Bare 02/22/2024, 1:10 PM

## 2024-02-22 NOTE — Progress Notes (Signed)
 Subjective: Postpartum Day # 1 : S/P NSVD due to pt was admitted on 4/10 at 26 weeks for preterm labor then delivery over intact perineum, ebl was , hgb drop of 11.4-10.5, gbs-, USD + MJ, OB UC multiple species noted, and pending redraw. Pt desires to see SW for [facility]. Patient up ad lib, denies syncope or dizziness. Reports consuming regular diet without issues and denies N/V. Patient reports 0 bowel movement + passing flatus.  Denies issues with urination and reports bleeding is "lighter."  Patient is planning on pump feeding but NB in NICU intubated.  Desires undecided for postpartum contraception.  Pain is being appropriately managed with use of po meds.   No laceration Feeding:  Pump Contraceptive plan:  undecided BB: Circ in pt when term prior to discharge  Objective: Vital signs in last 24 hours: Patient Vitals for the past 24 hrs:  BP Temp Temp src Pulse Resp SpO2  02/22/24 0434 100/72 98.2 F (36.8 C) Oral 65 18 100 %  02/21/24 2349 110/61 97.9 F (36.6 C) Oral 70 16 100 %  02/21/24 1945 114/60 98.4 F (36.9 C) Oral 75 19 99 %  02/21/24 1743 123/68 98.3 F (36.8 C) Oral 86 18 98 %  02/21/24 1341 119/72 98.4 F (36.9 C) Oral 72 18 100 %     Physical Exam:  General: alert, cooperative, and appears stated age Mood/Affect: happy Lungs: clear to auscultation, no wheezes, rales or rhonchi, symmetric air entry.  Heart: normal rate, regular rhythm, normal S1, S2, no murmurs, rubs, clicks or gallops. Breast: breasts appear normal, no suspicious masses, no skin or nipple changes or axillary nodes. Abdomen:  + bowel sounds, soft, non-tender GU: perineum intact, healing well. No signs of external hematomas.  Uterine Fundus: firm Lochia: appropriate Skin: Warm, Dry. DVT Evaluation: No evidence of DVT seen on physical exam. Negative Homan's sign. No cords or calf tenderness. Generalized Bilateral LLE edema.      Latest Ref Rng & Units 02/21/2024    5:47 AM 02/18/2024    10:51 PM 10/10/2023    9:09 PM  CBC  WBC 4.0 - 10.5 K/uL 10.5  13.2  7.7   Hemoglobin 12.0 - 15.0 g/dL 16.1  09.6  04.5   Hematocrit 36.0 - 46.0 % 32.4  35.1  41.1   Platelets 150 - 400 K/uL 137  137  166     No results found for this or any previous visit (from the past 24 hours).   CBG (last 3)  No results for input(s): "GLUCAP" in the last 72 hours.   I/O last 3 completed shifts: In: 0  Out: 2605 [Urine:2430; Blood:175]   Assessment Postpartum Day # 1 : S/P NSVD due to pt was admitted on 4/10 at 26 weeks for preterm labor then delivery over intact perineum, ebl was , hgb drop of 11.4-10.5, gbs-, USD + MJ, OB UC multiple species noted, and pending redraw. Pt desires to see SW for [facility]. Pt stable. -1 involution. Pump feeding. Hemodynamically stable.   Plan: Continue other mgmt as ordered OB UC: Pending results VTE prophylactics: Early ambulated as tolerates.  Pain control: Motrin/Tylenol PRN Education given regarding options for contraception, including barrier methods, injectable contraception, IUD placement, oral contraceptives.  Plan for discharge tomorrow, Lactation consult, Social Work consult, and Circumcision prior to discharge  Dr. Bernadette Brewster to be updated on patient status  Monica Mayer  CNM, FNP-C, PMHNP-BC  [id number]  [place], [place] [address]  Cell:  [phone]  Office Phone: [phone] Fax: [phone] 02/22/2024  9:24 AM

## 2024-02-23 LAB — CULTURE, OB URINE: Culture: <10000 — AB

## 2024-02-23 MED ORDER — BENZOCAINE-MENTHOL 20-0.5 % EX AERO: 1.0000 | INHALATION_SPRAY | CUTANEOUS | 1 refills | Status: AC | PRN

## 2024-02-23 MED ORDER — IBUPROFEN 600 MG PO TABS: 600.0000 mg | ORAL_TABLET | Freq: Four times a day (QID) | ORAL | 0 refills | Status: AC

## 2024-02-23 MED ORDER — ACETAMINOPHEN 325 MG PO TABS: 650.0000 mg | ORAL_TABLET | ORAL | 0 refills | Status: AC | PRN

## 2024-02-23 NOTE — Clinical Social Work Maternal (Signed)
 CLINICAL SOCIAL WORK MATERNAL/CHILD NOTE  Patient Details  Name: Monica Mayer MRN: [id number] Date of Birth: 07/01/1996  Date:  03/28/24  Clinical Social Worker Initiating Note:  [place]bye [place], [place] Date/Time: Initiated:  02/23/24/1212     Child's Name:  Monica Mayer   Biological Parents:  Mother, Father (Father: Monica Mayer)   Need for Interpreter:  None   Reason for Referral:  Parental Support of Premature Babies < 32 weeks/or Critically Ill babies, Current Substance Use/Substance Use During Pregnancy     Address:  [address] [place] [address]    Phone number:  [phone] (home)     Additional phone number:   Household Members/Support Persons (HM/SP):   Household Member/Support Person 1   HM/SP Name Relationship DOB or Age  HM/SP -1 Monica Mayer FOB    HM/SP -2        HM/SP -3        HM/SP -4        HM/SP -5        HM/SP -6        HM/SP -7        HM/SP -8          Natural Supports (not living in the home):  Parent   Professional Supports: None   Employment: Unemployed   Type of Work:     Education:  Engineer, agricultural   Homebound arranged:    Surveyor, quantity Resources:  Medicaid   Other Resources:      Cultural/Religious Considerations Which May Impact Care:    Strengths:  Understanding of illness   Psychotropic Medications:         Pediatrician:       Pediatrician List:   Radiographer, therapeutic    [place]    [facility]      Pediatrician Fax Number:    Risk Factors/Current Problems:  Substance Use  , Basic Needs     Cognitive State:  Able to Concentrate  , Alert  , Goal Oriented  , Linear Thinking  , Insightful     Mood/Affect:  Calm  , Interested  , Relaxed     CSW Assessment: CSW met with MOB at bedside to complete psychosocial assessment, FOB present. CSW introduced self and explained role. MOB shared that she has been waiting to speak with CSW. MOB was welcoming,  open, pleasant, and remained engaged during assessment. MOB reported that she and FOB are currently in the process of moving out of their place. MOB shared that the address on file is her family's address. MOB reported that they are unprepared for infant due to his early arrival. CSW normalized parents not having baby supplies at this time as infant has arrived early. CSW informed parents about [employer]'s [employer] if any assistance is needed obtaining items for infant. MOB reported that assistance with all items would be helpful, CSW agreed to make a referral. CSW inquired about MOB's support system, MOB reported that her mom is a support.   CSW and parents discussed infant's NICU admission. CSW informed parents about the NICU, what to expect, and resources/supports available. Parents reported that they feel well informed about infant's care. MOB reported that her only current concern is infant receiving donor milk as it's her preference for infant to get her breast milk. CSW agreed to ask a medical provider to come and speak with MOB about her  concerns, MOB agreeable and thanked CSW. Parents denied any transportation barriers with visiting infant in the NICU. CSW informed parents that infant qualifies to apply for SSI benefits, parents reported that they were interested. CSW explained SSI benefits and agreed to place information at infant's bedside.   CSW asked to speak with MOB privately, FOB agreed and left the room. CSW inquired about MOB's mental health history. MOB denied any mental health history. CSW inquired about how MOB was feeling emotionally since giving birth, MOB reported that she was feeling good and shocked that infant is here this early. MOB shared that she plans to try to take care of herself during this time as she knows she has to be okay for infant. CSW acknowledged, normalized, and validated MOB's feelings. CSW positively affirmed MOB's efforts to be intentional  about caring for herself during this difficult time. CSW encouraged MOB to take it easy and emphasized the importance of MOB eating and resting. MOB presented calm and did not demonstrate any acute mental health signs/symptoms. CSW assessed for safety, MOB denied SI, HI, and domestic violence.   CSW provided education regarding the baby blues period vs. perinatal mood disorders, discussed treatment and gave resources for mental health follow up if concerns arise.  CSW recommends self-evaluation during the postpartum time period using the New Mom Checklist from Postpartum Progress and encouraged MOB to contact a medical professional if symptoms are noted at any time.    CSW provided review of Sudden Infant Death Syndrome (SIDS) precautions.    CSW informed MOB about the hospital drug screen policy due to documented substance use during pregnancy. MOB confirmed smoking marijuana and vaping prior to confirming pregnancy at seven weeks. MOB denied any additional substance use. CSW inquired about any barriers to prenatal care, MOB reported none. MOB denied any barriers with getting infant to pediatrician appointments. CSW informed MOB that infant's UDS was negative and CDS would continue to be monitored and a CPS report would be made if warranted. MOB verbalized understanding and denied any questions.  CSW inquired about needed resources/supports. MOB reported that she was interested in housing resources as they have to find a new place to live. CSW informed MOB about local housing resources and agreed to place at infant's bedside. MOB reported that she was also interested in employment resources, CSW agreed to place at infant's bedside. MOB reported that she was interested in [facility] and food stamps. CSW agreed to complete [facility] referral and to provide MOB with the link to apply for food stamps. MOB reported that meal vouchers and a gas card would be helpful, CSW agreed to place at infant's bedside. MOB thanked CSW  and denied any additional needs.   CSW completed [facility] referral. CSW sent MOB the link to apply for food stamps.  CSW made FSN referral for requested items.  CSW place 6 meal vouchers and a gas card at infant's bedside.  CSW placed requested community resources and SSI information at infant's bedside.   CSW updated NNP of MOB's request to discuss concerns about donor breast milk, NNP agreed to follow up with MOB.   MOB opted to call CSW if any needs/concerns arise versus CSW checking in weekly.    CSW Plan/Description:  Sudden Infant Death Syndrome (SIDS) Education, Perinatal Mood and Anxiety Disorder (PMADs) Education, Hospital Drug Screen Policy Information, CSW Will Continue to Monitor Umbilical Cord Tissue Drug Screen Results and Make Report if Warranted, Supplemental Security Income (SSI) Information, Other Information/Referral to [employer],  Other Patient/Family Education, Psychosocial Support and Ongoing Assessment of Needs    Neta Baptist, LCSW 11/06/24, 12:15 PM

## 2024-02-23 NOTE — Discharge Summary (Signed)
 Postpartum Discharge Summary  Date of Service updated4/14/25     Patient Name: Monica Mayer DOB: 1996-04-11 MRN: [id number]  Date of admission: 02/18/2024 Delivery date:02/21/2024 Delivering provider: Lavonda Jumbo Date of discharge: 02/23/2024  Admitting diagnosis: Preterm labor [O60.00] Intrauterine pregnancy: [redacted]w[redacted]d     Secondary diagnosis:  Principal Problem:   Preterm labor Active Problems:   [redacted] weeks gestation of pregnancy   Preterm delivery   Normal postpartum course   IDA (iron deficiency anemia)  Additional problems: PTL    Discharge diagnosis: Preterm Pregnancy Delivered and Anemia                                              Post partum procedures: na Augmentation: N/A Complications: None  Hospital course: Onset of Labor With Vaginal Delivery      28 y.o. yo G1P0101 at [redacted]w[redacted]d was admitted in Latent Labor on 02/18/2024. Labor course was complicated byPTL tried to stop it with Magnesium  Membrane Rupture Time/Date: 3:51 AM,02/21/2024  Delivery Method:Vaginal, Spontaneous Operative Delivery:N/A Episiotomy: None Lacerations:  None Patient had a postpartum course complicated by PTL.  She is ambulating, tolerating a regular diet, passing flatus, and urinating well. Patient is discharged home in stable condition on 02/23/24.  Newborn Data: Birth date:02/21/2024 Birth time:3:52 AM Gender:Female Living status:Living Apgars:5 ,6  Weight:850 g  Magnesium Sulfate received: Yes: for neuroportection and tocolysis BMZ received: Yes Rhophylac:No MMR:No T-DaP:Given postpartum Flu: No RSV Vaccine received: No Transfusion:No Immunizations administered: Immunization History  Administered Date(s) Administered   Moderna Sars-Covid-2 Vaccination 09/15/2020, 10/19/2020   Tdap 02/23/2024    Physical exam  Vitals:   02/23/24 0205 02/23/24 0954 02/23/24 1202 02/23/24 1600  BP: 122/70 (!) 140/73 130/76 123/80  Pulse: 74 82 80 77  Resp: 18 18 17 18   Temp: 97.9 F  (36.6 C) 97.6 F (36.4 C) 97.8 F (36.6 C) 97.7 F (36.5 C)  TempSrc: Oral Oral Oral Oral  SpO2: 100% 100% 100% 100%  Weight:      Height:       General: alert and cooperative Lochia: appropriate Uterine Fundus: firm Incision: N/A DVT Evaluation: No evidence of DVT seen on physical exam. Labs: Lab Results  Component Value Date   WBC 10.5 02/21/2024   HGB 10.5 (L) 02/21/2024   HCT 32.4 (L) 02/21/2024   MCV 88.0 02/21/2024   PLT 137 (L) 02/21/2024      Latest Ref Rng & Units 02/18/2024   10:51 PM  CMP  Glucose 70 - 99 mg/dL 528   BUN 6 - 20 mg/dL <5   Creatinine 4.13 - 1.00 mg/dL 2.44   Sodium 010 - 272 mmol/L 134   Potassium 3.5 - 5.1 mmol/L 4.0   Chloride 98 - 111 mmol/L 101   CO2 22 - 32 mmol/L 20   Calcium 8.9 - 10.3 mg/dL 9.4   Total Protein 6.5 - 8.1 g/dL 7.2   Total Bilirubin 0.0 - 1.2 mg/dL 0.4   Alkaline Phos 38 - 126 U/L 78   AST 15 - 41 U/L 21   ALT 0 - 44 U/L 19    Edinburgh Score:    02/21/2024    6:00 PM  Edinburgh Postnatal Depression Scale Screening Tool  I have been able to laugh and see the funny side of things. 0  I have looked forward with enjoyment to things. 0  I have blamed myself unnecessarily when things went wrong. 1  I have been anxious or worried for no good reason. 2  I have felt scared or panicky for no good reason. 1  Things have been getting on top of me. 0  I have been so unhappy that I have had difficulty sleeping. 0  I have felt sad or miserable. 0  I have been so unhappy that I have been crying. 0  The thought of harming myself has occurred to me. 0  Edinburgh Postnatal Depression Scale Total 4      After visit meds:  Allergies as of 02/23/2024       Reactions   No Known Allergies         Medication List     STOP taking these medications    amoxicillin-clavulanate 875-125 MG tablet Commonly known as: AUGMENTIN       TAKE these medications    acetaminophen 325 MG tablet Commonly known as: Tylenol Take 2  tablets (650 mg total) by mouth every 4 (four) hours as needed (for pain scale < 4).   benzocaine-Menthol 20-0.5 % Aero Commonly known as: DERMOPLAST Apply 1 Application topically as needed for irritation (perineal discomfort).   ibuprofen 600 MG tablet Commonly known as: ADVIL Take 1 tablet (600 mg total) by mouth every 6 (six) hours.   Vitafol Gummies 3.33-0.333-34.8 MG Chew Chew 1 tablet by mouth daily.         Discharge home in stable condition Infant Feeding: Bottle and Breast Infant Disposition:NICU Discharge instruction: per After Visit Summary and Postpartum booklet. Activity: Advance as tolerated. Pelvic rest for 6 weeks.  Diet: routine diet Anticipated Birth Control: Unsure Postpartum Appointment:1 week Additional Postpartum F/U:    Future Appointments:No future appointments. Follow up Visit:  Follow-up Information     [facility]. Schedule an appointment as soon as possible for a visit in 6 week(s).   Specialty: Obstetrics and Gynecology Contact information: [address] [place]  [address] [phone]                    02/23/2024 Norville Beery, MD

## 2024-02-23 NOTE — Lactation Note (Signed)
 This note was copied from a baby's chart.  NICU Lactation Consultation Note  Patient Name: Monica Mayer [id number] Date: 02/23/2024 Age:27 hours  Reason for consult: Follow-up assessment; NICU baby; Primapara; 1st time breastfeeding; Preterm <34wks; Infant < 5lbs  SUBJECTIVE  LC in to visit with P1 Mom of [redacted]w[redacted]d PMA infant "Monica Mayer" in the NICU.  Mom is hoping to be discharged today, watching her BP.    Mom's milk volume is increasing and she is consistently pumping with the knowledge of goal is 8 times per 24 hrs.  Talked to parents regarding the amazing benefit of baby being fed mother's own milk and encouraged consistent pumping to continue.  Mom's breasts are fuller and heavier today, no engorgement.  Mom aware of engorgement prevention and treatment recommended.  Mom aware of lactation support available to her and encouraged to ask for LC prn.  OBJECTIVE Infant data: Mother's Current Feeding Choice: Breast Milk  O2 Device: Si-PAP FiO2 (%): 28 %  Infant feeding assessment No data recorded  Maternal data: G1P0101 Vaginal, Spontaneous Has patient been taught Hand Expression?: Yes Hand Expression Comments: colostrum leaking and easily expressed Significant Breast History:: (+) breast changes during the pregnancy Current breast feeding challenges:: NICU admission Does the patient have breastfeeding experience prior to this delivery?: No Pumping frequency: 6-8 times per 24 hrs Pumped volume: 60 mL Flange Size: 18 Hands-free pumping top sizes: Small/Medium (Blue) Risk factor for low/delayed milk supply:: primipara, prematurity, infant separation, < 2 lbs, delayed onset of pumping  Pump: Received Stork Pump Spectra   ASSESSMENT Infant:  Feeding Status: Scheduled 8-11-2-5 Feeding method: Tube/Gavage (Bolus)  Maternal: Milk volume: Normal  INTERVENTIONS/PLAN Interventions: Interventions: Breast feeding basics reviewed; Skin to skin; Breast massage; Hand express;  Education; DEBP Discharge Education: Engorgement and breast care Tools: Pump; Flanges; Hands-free pumping top Pump Education: Setup, frequency, and cleaning; Milk Storage  Plan: Consult Status: NICU follow-up NICU Follow-up type: Maternal D/C visit; Verify onset of copious milk; Verify absence of engorgement   Dario Edison 02/23/2024, 12:37 PM

## 2024-02-28 ENCOUNTER — Ambulatory Visit (HOSPITAL_COMMUNITY): Payer: Self-pay

## 2024-02-28 NOTE — Lactation Note (Addendum)
 This note was copied from a baby's chart.  NICU Lactation Consultation Note  Patient Name: Monica Mayer Date: 02/28/2024 Age:27 days  Reason for consult: Weekly NICU follow-up; NICU baby; Infant < 5lbs; Primapara; 1st time breastfeeding; Preterm <34wks; RN request  SUBJECTIVE Visited with family of 8 65/88 weeks old AGA NICU female "Monica Mayer"; Monica Mayer reported she's been pumping but not as consistently as she wishes; her supply remains WNL though. She voiced some nipple discomfort (see maternal assessment) provided shells and explained the importance of consistent pumping for the prevention of engorgement and to protect her supply; she's been leaking. Noticed that she hasn't gotten the inserts for her Spectra  pump yet. She stopped using her Spectra  pump on 02/26/2024 because one of the tubing pieces is not having a good connection and unable to provide suction. She's been using the pump in baby's room in the meantime asked her to bring her Spectra  Stork pump to troubleshoot and contacted [employer] to let them know. Spoke to [employer] rep "Ada" and she'll get in hold with patient for troubleshooting/pump replacement at their retail store.   OBJECTIVE Infant data: Mother's Current Feeding Choice: Breast Milk  O2 Device: Si-PAP FiO2 (%): 21 %  Maternal data: G1P0101 Vaginal, Spontaneous Pumping frequency: 4-5 times/24 hours Pumped volume: 180 mL (180-210 ml)  Pump:  (Offered loaner pump on 02/28/2024)  ASSESSMENT Infant: Feeding Status: Scheduled 8-11-2-5 Feeding method: Tube/Gavage (Bolus)  Maternal: Milk volume: Normal No S/S of nipple trauma, both nipples are intact  INTERVENTIONS/PLAN Interventions: Interventions: Breast feeding basics reviewed; DEBP; Education; Shells Tools: Shells; Other (comment) (Breast pads)  Plan: STS once able to Pump both breasts on maintain mode every 3 hours for 30 minutes; ideally 8 pumping sessions/24 hours Start using  breastshells, daytime only F/U with [employer] for pump troubleshoot/replacement   FOB present and supportive. All questions and concerns answered, family to contact [facility] services PRN.  Consult Status: NICU follow-up NICU Follow-up type: Weekly NICU follow up   Artemio Dobie S Clementine Cutting 02/28/2024, 1:37 PM

## 2024-03-02 ENCOUNTER — Telehealth (HOSPITAL_COMMUNITY): Payer: Self-pay | Admitting: *Deleted

## 2024-03-02 NOTE — Telephone Encounter (Signed)
 03/02/2024  Name: Monica Mayer MRN: [id number] DOB: 1996/11/04  Reason for Call:  Transition of Care Hospital Discharge Call  Contact Status: Patient Contact Status: Complete  Language assistant needed: Interpreter Mode: Interpreter Not Needed        Follow-Up Questions: Do You Have Any Concerns About Your Health As You Heal From Delivery?: Yes What Concerns Do You Have About Your Health?: Patient asks how long she should expect to have vaginal bleeding.  We discussed normals for lochia and parameters for calling provider. Do You Have Any Concerns About Your Infants Health?: Infant in NICU  Edinburgh Postnatal Depression Scale:  In the Past 7 Days: I have been able to laugh and see the funny side of things.: Not quite so much now I have looked forward with enjoyment to things.: As much as I ever did I have blamed myself unnecessarily when things went wrong.: Not very often I have been anxious or worried for no good reason.: Hardly ever I have felt scared or panicky for no good reason.: No, not at all Things have been getting on top of me.: Yes, sometimes I haven't been coping as well as usual I have been so unhappy that I have had difficulty sleeping.: Not at all I have felt sad or miserable.: Not very often I have been so unhappy that I have been crying.: No, never The thought of harming myself has occurred to me.: Never Edinburgh Postnatal Depression Scale Total: 6  PHQ2-9 Depression Scale:     Discharge Follow-up: Edinburgh score requires follow up?: No Patient was advised of the following resources:: Support Group, Breastfeeding Support Group  Post-discharge interventions: NA  Pearlie Bougie, RN 03/02/2024 14:52

## 2024-03-16 ENCOUNTER — Ambulatory Visit (HOSPITAL_COMMUNITY): Payer: Self-pay

## 2024-03-16 NOTE — Lactation Note (Signed)
 This note was copied from a baby's chart.  NICU Lactation Consultation Note  Patient Name: Boy Alyviah Martz [id number] Date: 03/16/2024 Age:27 wk.o.  Reason for consult: Follow-up assessment; NICU baby; Primapara; 1st time breastfeeding; Infant < 5lbs; Preterm <34wks; Mother's request  SUBJECTIVE  LC in to visit with "Kanan's" Mom.  Baby sleeping contently STS while gavage feeding running.   Mom describes stressors in her life that has side railed her consistent pumping and caused her milk supply to drop.  LC listened to Mom and empathized with her.  Mom hasn't spoken to her OB about her stress and depressive symptoms.  Encouraged her to do so, as talk therapy and some antidepressants like Sertraline, are safe with breastfeeding.  Mom desires a referral to NICU CSW.  LC noted pump parts soaking in a basin in the bathroom sink.  Mom educated on the importance of using the counter in baby's room, and not to dry pump parts on the edge of the sink.  LC took the pump parts, and rewashed them and placed them in a labeled basin for drying.    Talked to Mom regarding some herbal supplements being beneficial, but LC recommended increasing frequency of pumping first to see if milk supply could rebound.  Mom in agreement.  Encouraged Mom to ask for LC prn.  Praised Mom for continuing to pump for her sweet baby. Mom's goal is to direct breastfeed when baby is ready for PO feedings.  OBJECTIVE Infant data: No data recorded O2 Device: CPAP (RAM) FiO2 (%): 21 %  Infant feeding assessment IDFTS - Readiness: 5 (CPAP 7)   Maternal data: G1P0101 Vaginal, Spontaneous Pumping frequency: hasn't been consistently pumping, will try to increase to 6-8 times per 24 hrs starting today Pumped volume: 45 mL Flange Size: 18  Pump: Personal, DEBP (Spectra  pump)  ASSESSMENT Infant:  Feeding Status: Scheduled 8-11-2-5 Feeding method: Tube/Gavage (Bolus)  Maternal: Milk volume:  Low  INTERVENTIONS/PLAN Interventions: Interventions: Breast feeding basics reviewed; Skin to skin; Breast massage; Hand express; DEBP; Education Tools: Pump; Flanges Pump Education: Setup, frequency, and cleaning; Milk Storage  Plan: Consult Status: NICU follow-up NICU Follow-up type: Weekly NICU follow up; Verify onset of copious milk   Dario Edison 03/16/2024, 11:11 AM

## 2024-03-26 ENCOUNTER — Ambulatory Visit (HOSPITAL_COMMUNITY): Payer: Self-pay

## 2024-03-26 NOTE — Lactation Note (Signed)
 This note was copied from a baby's chart.  NICU Lactation Consultation Note  Patient Name: Monica Mayer [id number] Date: 03/26/2024 Age:27 wk.o.  Reason for consult: Weekly NICU follow-up; NICU baby; Primapara; 1st time breastfeeding; Infant < 5lbs; Preterm <34wks  SUBJECTIVE Visited with family of 85 27/4 weeks old AGA NICU female "Kanan"; Ms. Quincey reported that she's still pumping but her supply continues to dwindle. Noticed that pumping is still not consistent. Re-educated about the importance of consistent pumping to protect her supply and for the delayed onset of her menses; she'll be 5 weeks post-partum tomorrow. Revised strategies to increase/sustain supply. She voiced she's experiencing a lot of stress, due to hosing, financial difficulties and of course baby's ICU stay. She had to moved out of her apartment, and then moved back in when she was allowed to renew her lease. She has already figured out how to make her Spectra  pump work with some of the Medela pump parts. She reported that the suction in her Spectra  at home is actually stronger than the hospital grade pump. Baby "[name]" currently receiving all maternal breastmilk feedings, praised Ms. Boldman for all her efforts.   OBJECTIVE Infant data: Mother's Current Feeding Choice: Breast Milk  O2 Device: CPAP FiO2 (%): 21 %  Maternal data: G1P0101 Vaginal, Spontaneous Pumping frequency: 3-4 times/24 hours Pumped volume: 15 mL  Pump: Received Stork Pump (Spectra  S2)  ASSESSMENT Infant: Feeding Status: Scheduled 8-11-2-5 Feeding method: Tube/Gavage (Bolus)  Maternal: Milk volume: Low  INTERVENTIONS/PLAN Interventions: Interventions: Breast feeding basics reviewed; DEBP; Skin to skin; Education; Coconut oil  Plan: STS around care times, pump right after Pump both breasts on maintain mode every 3 hours for 30 minutes; ideally 8 pumping sessions/24 hours Power pump once/day   FOB present and supportive. All  questions and concerns answered, family to contact [facility] services PRN.  Consult Status: NICU follow-up NICU Follow-up type: Weekly NICU follow up   Joanathan Affeldt S Clementine Cutting 03/26/2024, 2:51 PM

## 2024-03-31 ENCOUNTER — Ambulatory Visit (HOSPITAL_COMMUNITY): Payer: Self-pay

## 2024-03-31 NOTE — Lactation Note (Signed)
 This note was copied from a baby's chart. Lactation Consultation Note  Patient Name: Monica Mayer [id number] Date: 03/31/2024 Age:27 wk.o. Reason for consult: Follow-up assessment;Primapara;1st time breastfeeding;Infant < 5lbs;Preterm <34wks (this LC was called By Kaitlynn, NICU nurse due to mom having breast feeding questions.) Per mom has had discomfort in the inner aspect of the left breast with pumping. Mom denies any plugged ducts or engorgement,  LC offered to assess  left breast tissue and mom receptive. Nipples clear of breakdown, and no plugged ducts noted in the inner aspect of the left breast 2 inches from the outer edge of the areola or under the areola. LC asked mom about how high she was setting the dial on the pump. She mentioned 6 to start and then pushing the let down for a period of time. Mom also expressed concerns about her volume decreasing due to stress which now has settled down.  LC recommended  Applying moist heat compress on the inner aspect of the left breast and then pump.  Easing into dialing the suction up and decreasing the length of time for the let down and do it more often .  Hand express prior to pumping and after pumping.  LC recommended to continue pumping  8-10 times a day and once a day try a power pump over 60 mins ( 20 mins on 10 mins off ) .   Maternal Data    Feeding Mother's Current Feeding Choice: Breast Milk    Lactation Tools Discussed/Used Tools: Pump;Flanges Flange Size: 18 Breast pump type: Double-Electric Breast Pump Pump Education: Setup, frequency, and cleaning;Milk Storage Pumped volume:  (per mom the most she has pumped off at one sitting in 24 hours has decreased 20 - 60 ml)  Interventions  Education   Discharge Pump: Personal;DEBP (stork DEBP)  Consult Status Consult Status: NICU follow-up Date: 04/01/24 Follow-up type: In-patient    Monica Mayer 03/31/2024, 12:08 PM

## 2024-04-01 ENCOUNTER — Ambulatory Visit (HOSPITAL_COMMUNITY): Payer: Self-pay

## 2024-04-01 NOTE — Lactation Note (Signed)
 This note was copied from a baby's chart.  NICU Lactation Consultation Note  Patient Name: Monica Mayer [id number] Date: 04/01/2024 Age:27 wk.o.  Reason for consult: Follow-up assessment; NICU baby; Preterm <34wks; Infant < 5lbs; Primapara; 1st time breastfeeding  SUBJECTIVE  LC in to visit with P1 Mom of baby "Monica Mayer" in the NICU.  Mom had some breast pain and was consulted by MB LC yesterday.  Mom was applying moist heat prior to and during pumping.  Her breast feels better and her milk supply has increased.  Mom aware of the importance of consistent pumping to prevent engorgement/ breast pain and low milk supply.  Mom has been power-pumping once a day.  Baby placed STS on Mom's chest, with chair reclined.  Plan- 1- STS with baby during care times 2- pump both breasts 15-30 mins 8 times per 24 hrs 3- Ask for LC prn  OBJECTIVE Infant data: Mother's Current Feeding Choice: Breast Milk  O2 Device: HHFNC O2 Flow Rate (L/min): 2 L/min FiO2 (%): 21 %  Infant feeding assessment IDFTS - Readiness: 3   Maternal data: G1P0101 Vaginal, Spontaneous Pumping frequency: increasing her frequency to 6-8 times per 24 hrs Pumped volume: 90 mL (90-120 ml) Flange Size: 18 Hands-free pumping top sizes: Small/Medium (Blue)  Pump: Received Stork Pump (Spectra  S2)  ASSESSMENT Infant:  Feeding Status: Scheduled 8-11-2-5 Feeding method: Tube/Gavage (Bolus)  Maternal: Milk volume: Normal  INTERVENTIONS/PLAN Interventions: Interventions: Breast feeding basics reviewed; Skin to skin; Breast massage; Hand express; DEBP; Education Tools: Pump; Flanges Pump Education: Setup, frequency, and cleaning; Milk Storage  Plan: Consult Status: NICU follow-up NICU Follow-up type: Weekly NICU follow up   Dario Edison 04/01/2024, 3:50 PM

## 2024-04-14 ENCOUNTER — Encounter (HOSPITAL_COMMUNITY): Payer: Self-pay | Admitting: Obstetrics and Gynecology

## 2024-04-14 ENCOUNTER — Inpatient Hospital Stay (HOSPITAL_COMMUNITY)
Admission: AD | Admit: 2024-04-14 | Discharge: 2024-04-14 | Disposition: A | Discharge status: Home / Self Care | Attending: Family Medicine | Admitting: Family Medicine

## 2024-04-14 DIAGNOSIS — N750 Cyst of Bartholin's gland: Secondary | ICD-10-CM | POA: Diagnosis present

## 2024-04-14 MED ORDER — SULFAMETHOXAZOLE-TRIMETHOPRIM 800-160 MG PO TABS: 1.0000 | ORAL_TABLET | Freq: Two times a day (BID) | ORAL | 0 refills | Status: AC

## 2024-04-14 MED ORDER — LIDOCAINE HCL (PF) 1 % IJ SOLN
20.0000 mL | Freq: Once | INTRAMUSCULAR | Status: AC
  Administered 2024-04-14: 10 mL via INTRADERMAL
  Filled 2024-04-14: qty 20

## 2024-04-14 MED ORDER — OXYCODONE HCL 5 MG PO TABS
10.0000 mg | ORAL_TABLET | Freq: Once | ORAL | Status: AC
  Administered 2024-04-14: 10 mg via ORAL
  Filled 2024-04-14: qty 2

## 2024-04-14 MED ORDER — OXYCODONE-ACETAMINOPHEN 5-325 MG PO TABS: 1.0000 | ORAL_TABLET | Freq: Four times a day (QID) | ORAL | 0 refills | Status: AC | PRN

## 2024-04-14 NOTE — Progress Notes (Signed)
Consent for I&D signed

## 2024-04-14 NOTE — MAU Note (Signed)
 Monica Mayer is a 28 y.o. at [redacted]w[redacted]d here in MAU reporting bartholin cyst on her R side. She has had them in the past on the R. She noticed it starting Monday and it has progressively worsened. She was here seeing her son in NICU who is doing well. Has not taken anything for pain  LMP: n/a Onset of complaint: Monday Pain score: 10 Vitals:   04/14/24 0120 04/14/24 0122  BP:  (!) 129/92  Pulse: 82   Resp: 17   Temp: 98.4 F (36.9 C)   SpO2: 100%      FHT: n/a  Lab orders placed from triage: none

## 2024-04-14 NOTE — MAU Provider Note (Signed)
 History     [id number]  Arrival date and time: 04/14/24 0034    Chief Complaint  Patient presents with   Bartholin's Cyst     HPI Monica Mayer is a 28 y.o. s/p NSVD on 02/21/2024 at 26 weeks with PMHx notable for recurrent bartholin cysts, who presents for concern for recurrent bartholin cyst.   Patient reports she has had an enlarging mass at R lower edge of vaginal introitus for the past two days Does endorse shaving in that area about 5 days ago No fevers Mass has becoming increasingly painful Reports 4 prior episodes of Bartholins requiring drainage   --/--/A POS (04/09 2254)  OB History as of 02/21/2024     Gravida  1   Para  1   Term      Preterm  1   AB      Living  1      SAB      IAB      Ectopic      Multiple  0   Live Births  1           Past Medical History:  Diagnosis Date   Medical history non-contributory     Past Surgical History:  Procedure Laterality Date   FRACTURE SURGERY     Right arm fracture repair    History reviewed. No pertinent family history.  Social History   Socioeconomic History   Marital status: Single    Spouse name: Not on file   Number of children: Not on file   Years of education: Not on file   Highest education level: Not on file  Occupational History   Not on file  Tobacco Use   Smoking status: Never   Smokeless tobacco: Never  Vaping Use   Vaping status: Former  Substance and Sexual Activity   Alcohol use: Not Currently    Comment: occ   Drug use: Not Currently    Types: Marijuana    Comment: pt states she found out she was pregnant and stopped smoking marijuana at 7 weeks.   Sexual activity: Yes    Birth control/protection: None  Other Topics Concern   Not on file  Social History Narrative   Not on file   Social Drivers of Health   Financial Resource Strain: Not on file  Food Insecurity: No Food Insecurity (02/19/2024)   Hunger Vital Sign    Worried About Running Out  of Food in the Last Year: Never true    Ran Out of Food in the Last Year: Never true  Transportation Needs: No Transportation Needs (02/19/2024)   PRAPARE - Administrator, Civil Service (Medical): No    Lack of Transportation (Non-Medical): No  Physical Activity: Not on file  Stress: Not on file  Social Connections: Unknown (03/26/2022)   [facility]   Social Network    Social Network: Not on file  Intimate Partner Violence: Not At Risk (02/19/2024)   Humiliation, Afraid, Rape, and Kick questionnaire    Fear of Current or Ex-Partner: No    Emotionally Abused: No    Physically Abused: No    Sexually Abused: No    No Known Allergies  No current facility-administered medications on file prior to encounter.   Current Outpatient Medications on File Prior to Encounter  Medication Sig Dispense Refill   acetaminophen  (TYLENOL ) 325 MG tablet Take 2 tablets (650 mg total) by mouth every 4 (four) hours as needed (  for pain scale < 4). 30 tablet 0   ibuprofen  (ADVIL ) 600 MG tablet Take 1 tablet (600 mg total) by mouth every 6 (six) hours. 30 tablet 0   sertraline (ZOLOFT) 50 MG tablet Take 50 mg by mouth daily.     benzocaine -Menthol  (DERMOPLAST) 20-0.5 % AERO Apply 1 Application topically as needed for irritation (perineal discomfort). (Patient taking differently: Apply 1 Application topically as needed for irritation (perineal discomfort). Pt states she never received this) 78 g 1   Prenatal Vit-Fe Phos-FA-Omega (VITAFOL  GUMMIES) 3.33-0.333-34.8 MG CHEW Chew 1 tablet by mouth daily. (Patient not taking: Reported on 04/14/2024) 90 tablet 5     ROS Pertinent positives and negative per HPI, all others reviewed and negative  Physical Exam   BP 120/81 (BP Location: Right Arm)   Pulse 61   Temp 98.4 F (36.9 C)   Resp 17   Ht 5\' 2"  (1.575 m)   Wt 68.3 kg   SpO2 100%   BMI 27.55 kg/m   Patient Vitals for the past 24 hrs:  BP Temp Pulse Resp SpO2  Height Weight  04/14/24 0156 120/81 -- 61 -- -- -- --  04/14/24 0122 (!) 129/92 -- -- -- -- -- --  04/14/24 0120 -- 98.4 F (36.9 C) 82 17 100 % 5\' 2"  (1.575 m) 68.3 kg    Physical Exam Vitals reviewed.  Constitutional:      General: She is not in acute distress.    Appearance: She is well-developed. She is not diaphoretic.  Eyes:     General: No scleral icterus. Pulmonary:     Effort: Pulmonary effort is normal. No respiratory distress.  Genitourinary:    Comments: ~3-4 cm mass at 8 o clock in vaginal introitus. No significant overlying erythema or induration Skin:    General: Skin is warm and dry.  Neurological:     Mental Status: She is alert.     Coordination: Coordination normal.       Labs No results found for this or any previous visit (from the past 24 hours).  Imaging No results found.  MAU Course  Procedures Lab Orders         Aerobic/Anaerobic Culture w Gram Stain (surgical/deep wound)    Meds ordered this encounter  Medications   lidocaine  (PF) (XYLOCAINE ) 1 % injection 20 mL   oxyCODONE (Oxy IR/ROXICODONE) immediate release tablet 10 mg    Refill:  0   sulfamethoxazole-trimethoprim (BACTRIM DS) 800-160 MG tablet    Sig: Take 1 tablet by mouth 2 (two) times daily for 5 days.    Dispense:  10 tablet    Refill:  0   oxyCODONE-acetaminophen  (PERCOCET/ROXICET) 5-325 MG tablet    Sig: Take 1 tablet by mouth every 6 (six) hours as needed for severe pain (pain score 7-10).    Dispense:  6 tablet    Refill:  0   Imaging Orders  No imaging studies ordered today    MDM Moderate (Level 3-4)  Assessment and Plan  #Bartholin gland abscess After informed consent was obtained I&D of bartholins abscess was performed, see procedural documentation below for details. Rx sent for 5 days of bactrim, patient instructed to pump and dump while taking this medication. Discussed with patient she will need to have marsupialization in the future if recurs due to large  number of recurrences.    Dispo: discharged to home in stable condition   Procedure Note Bartholin Cyst Incision & Drainage Procedure Note Enlarged abscess palpated in  front of the hymenal ring at 7 o'clock.  Written informed consent was obtained.  Discussed complications and possible outcomes of procedure including recurrence of cyst, scarring leading to infection, bleeding, dyspareunia, distortion of anatomy.  Patient was examined in the dorsal lithotomy position and mass was identified.  The area was prepped with Iodine and draped in a sterile manner. 1% Lidocaine  (3 ml) was then used to infiltrate area on top of the cyst, behind the hymenal ring.  A 15 mm incision was made using a sterile scapel. Upon palpation of the mass, a copious amount of pus drainage was expressed through the incision. A hemostat was used to break up loculations, which resulted in expression of more bloody purulent drainage.  Cultures were sent.   The open cyst was then expressed of all remaining fluid.  - Antibiotics were sent, recommended a course of Bactrim DS BID x5 days - Recommended Sitz baths TID and Percocet PRN for pain.    - She was told to call to be examined if she experiences increasing swelling, pain, vaginal discharge, or fever.   - She was instructed to wear a peripad to absorb discharge, and to maintain pelvic rest while healing     Teena Feast, MD/MPH 04/14/24 2:46 AM  Allergies as of 04/14/2024   No Known Allergies      Medication List     TAKE these medications    acetaminophen  325 MG tablet Commonly known as: Tylenol  Take 2 tablets (650 mg total) by mouth every 4 (four) hours as needed (for pain scale < 4).   benzocaine -Menthol  20-0.5 % Aero Commonly known as: DERMOPLAST Apply 1 Application topically as needed for irritation (perineal discomfort).   ibuprofen  600 MG tablet Commonly known as: ADVIL  Take 1 tablet (600 mg total) by mouth every 6 (six) hours.    oxyCODONE-acetaminophen  5-325 MG tablet Commonly known as: PERCOCET/ROXICET Take 1 tablet by mouth every 6 (six) hours as needed for severe pain (pain score 7-10).   sertraline 50 MG tablet Commonly known as: ZOLOFT Take 50 mg by mouth daily.   sulfamethoxazole-trimethoprim 800-160 MG tablet Commonly known as: BACTRIM DS Take 1 tablet by mouth 2 (two) times daily for 5 days.   Vitafol  Gummies 3.33-0.333-34.8 MG Chew Chew 1 tablet by mouth daily.

## 2024-04-14 NOTE — Progress Notes (Signed)
 I&D performed by Roselinda Congress, MD

## 2024-04-20 ENCOUNTER — Ambulatory Visit (HOSPITAL_COMMUNITY): Payer: Self-pay

## 2024-04-20 LAB — AEROBIC/ANAEROBIC CULTURE W GRAM STAIN (SURGICAL/DEEP WOUND): Culture: NORMAL

## 2024-04-20 NOTE — Lactation Note (Signed)
 This note was copied from a baby's chart.  NICU Lactation Consultation Note  Patient Name: Boy Terren Haberle ZOXWR'U Date: 04/20/2024 Age:27 wk.o.  Reason for consult: Follow-up assessment; NICU baby; Late-preterm 34-36.6wks; Infant < 5lbs; 1st time breastfeeding; Primapara; RN request  SUBJECTIVE  Mom requesting another small pumping top.  LC in to assist with this and Mom was quiet as she had just woken up.   LC noticed pump parts drying on the edge of the sink in the bathroom.  LC recommended she utilize the washing and drying basins on the counter out in the room, explaining that the sink isn't clean.  LC made sure the 2 bins were set up and ready.   Mom seemed very tired and not wanting to talk at present.  LC will F/U at another time.  OBJECTIVE Infant data: No data recorded O2 Device: HHFNC O2 Flow Rate (L/min): 4 L/min FiO2 (%): 21 %  Infant feeding assessment IDFTS - Readiness: -- (HFNC 4L, accepts paci)   Maternal data: G1P0101 Vaginal, Spontaneous Pumping frequency: 7-8 times per 24 hrs Pumped volume: 30 mL (30-60 ml) Flange Size: 18 Hands-free pumping top sizes: Small/Medium (Blue)  Pump: Received Stork Pump (Spectra  S2)  ASSESSMENT Infant: Feeding Status: Scheduled 8-11-2-5 Feeding method: Tube/Gavage (Bolus)  Maternal: Milk volume: Low  INTERVENTIONS/PLAN Interventions: Interventions: DEBP; Education Tools: Pump; Flanges; Hands-free pumping top Pump Education: Setup, frequency, and cleaning; Milk Storage  Plan: Consult Status: NICU follow-up NICU Follow-up type: Weekly NICU follow up   Dario Edison 04/20/2024, 2:20 PM

## 2024-05-03 NOTE — Lactation Note (Signed)
 This note was copied from a baby's chart.  NICU Lactation Consultation Note  Patient Name: Boy Eliyanah Elgersma Unijb'd Date: 05/03/2024 Age:27 m.o.  Reason for consult: Weekly NICU follow-up; NICU baby; Infant < 5lbs; Primapara; 1st time breastfeeding; Late-preterm 34-36.6wks  SUBJECTIVE Visited with family of 64 58/77 weeks old AGA NICU female [place]; Ms. Alcaraz is P62 and reported but she's still pumping but she's having challenges following the 3-hour schedule, she has also been power pumping; she's doing the best she can. Noticed that her supply continues to dwindle; Derryl currently receiving some feedings of breastmilk but mostly Similac 24 calorie formula. Reviewed strategies to increase/sustain supply and praised her for all her efforts.   OBJECTIVE Infant data: Mother's Current Feeding Choice: Breast Milk and Formula  O2 Device: HHFNC O2 Flow Rate (L/min): 3 L/min FiO2 (%): 21 %  Infant feeding assessment IDFTS - Readiness: 5 (HFNC 3L, accepts paci)   Maternal data: G1P0101 Vaginal, Spontaneous Pumping frequency: 3-4 times/24 hours Pumped volume: 10 mL (10-15 ml; up to 40 ml in the AM)  Pump: Received Stork Pump (Spectra  S2)  ASSESSMENT Infant: Feeding Status: Scheduled 8-11-2-5 Feeding method: Tube/Gavage (Bolus) Nipple Type: no flow nipple  Maternal: Milk volume: Low  INTERVENTIONS/PLAN Interventions: Interventions: Breast feeding basics reviewed; Coconut oil; DEBP; Education  Plan: STS around care times, pump right after Pump both breasts on maintain mode every 3 hours for 30 minutes; ideally 8 pumping sessions/24 hours; she'll do what she can; might go at her own pace Power pump once/day or whenever it fits her schedule   FOB present. All questions and concerns answered, family to contact [facility] services PRN.  Consult Status: NICU follow-up NICU Follow-up type: Weekly NICU follow up   Alicyn Klann GORMAN Crate 05/03/2024, 6:56 PM
